# Patient Record
Sex: Female | Born: 1935 | Race: White | Hispanic: No | Smoking: Never smoker
Health system: Southern US, Community
[De-identification: ages and names within clinical notes are randomized; demographics above are authoritative.]

## PROBLEM LIST (undated history)

## (undated) DIAGNOSIS — Z9889 Other specified postprocedural states: Secondary | ICD-10-CM

## (undated) DIAGNOSIS — I499 Cardiac arrhythmia, unspecified: Secondary | ICD-10-CM

## (undated) DIAGNOSIS — Z8719 Personal history of other diseases of the digestive system: Secondary | ICD-10-CM

## (undated) DIAGNOSIS — M1990 Unspecified osteoarthritis, unspecified site: Secondary | ICD-10-CM

## (undated) DIAGNOSIS — H9190 Unspecified hearing loss, unspecified ear: Secondary | ICD-10-CM

## (undated) DIAGNOSIS — E785 Hyperlipidemia, unspecified: Secondary | ICD-10-CM

## (undated) DIAGNOSIS — Z974 Presence of external hearing-aid: Secondary | ICD-10-CM

## (undated) DIAGNOSIS — R112 Nausea with vomiting, unspecified: Secondary | ICD-10-CM

## (undated) HISTORY — PX: RECTOCELE REPAIR: SHX761

## (undated) HISTORY — PX: TYMPANOPLASTY: SHX33

## (undated) HISTORY — PX: EYE SURGERY: SHX253

## (undated) HISTORY — PX: JOINT REPLACEMENT: SHX530

## (undated) HISTORY — PX: CARPAL TUNNEL RELEASE: SHX101

## (undated) HISTORY — PX: ABDOMINAL HYSTERECTOMY: SHX81

---

## 2000-01-07 ENCOUNTER — Other Ambulatory Visit: Admission: RE | Admit: 2000-01-07 | Discharge: 2000-01-07 | Payer: Self-pay | Admitting: Internal Medicine

## 2007-05-27 ENCOUNTER — Inpatient Hospital Stay (HOSPITAL_COMMUNITY): Admission: RE | Admit: 2007-05-27 | Discharge: 2007-05-31 | Payer: Self-pay | Admitting: Orthopedic Surgery

## 2007-09-27 ENCOUNTER — Encounter: Admission: RE | Admit: 2007-09-27 | Discharge: 2007-09-27 | Payer: Self-pay | Admitting: Gastroenterology

## 2010-09-24 NOTE — Op Note (Signed)
Kimberly Donovan, Kimberly Donovan              ACCOUNT NO.:  [id number]   MEDICAL RECORD NO.:  [id number]          PATIENT TYPE:  INP   LOCATION:  2550                         FACILITY:  [facility]   PHYSICIAN:  Vania Rea. Supple, M.D.  DATE OF BIRTH:  1918/09/06   DATE OF PROCEDURE:  05/27/2007  DATE OF DISCHARGE:                               OPERATIVE REPORT   PREOPERATIVE DIAGNOSIS:  End-stage right knee osteoarthrosis.   POSTOPERATIVE DIAGNOSIS:  End-stage right knee osteoarthrosis.   PROCEDURE:  Right cemented total knee arthroplasty utilizing a DePuy  Sigma implant, size 3 femur, a size 3 three tibia, a 10-mm thick  rotating platform polyethylene insert and a 35-mm patellar button.   SURGEON OF RECORD:  Vania Rea. Supple, M.D.   ASSISTANT:  Tray Shuford, PA-C.   ANESTHESIA:  Spinal as well as a preop femoral nerve block.   TOURNIQUET TIME:  1 hour 4 minutes.   ESTIMATED BLOOD LOSS:  Approximately 150 mL.   DRAINS:  Hemovac x1.   HISTORY:  Ms. Exley is an 77 year old female who has had chronic and  progressively increasing right knee pain and functional limitations  secondary end-stage osteoarthrosis.  Due to her ongoing pain and  functional limitation, she is brought to operating room at this time for  planned right total knee arthroplasty.   Preoperatively I counseled Ms. Hayes on treatment options as well as  risks versus benefits thereof.  The possible surgical complications  including bleeding, infection, neurovascular injury, DVT, PE, persistent  pain, loss motion and possible need for revision surgery were all  reviewed.  She understands and accepts and agrees with our planned  procedure.   PROCEDURE IN DETAIL:  After undergoing routine preop evaluation, the  patient received prophylactic antibiotics.  A femoral nerve block was  established in the holding area by the anesthesia department.  Taken to  the operating room and on the operating table in the lateral position  had a  spinal anesthetic placed by the anesthesia department.  She was  then placed supine.  A Foley catheter was placed.  A tourniquet applied  to the right thigh.  The right leg was sterilely prepped and draped in  standard fashion.  The leg was exsanguinated with the tourniquet  inflated to 300 mmHg.  An anterior midline incision then made  approximately 15 cm in length, centered over the patella.  Skin flaps  were circumferentially mobilized and sutured back.  Electrocautery was  used for hemostasis.  A medial parapatellar arthrotomy was performed.  The patella was everted and approximately half of the fat pad was  excised.  The knee was then flexed up and the remnants of menisci were  removed as the cruciate ligaments were divided.  A drill was then used  to gain access to the femoral intramedullary canal and then an  intramedullary guide was passed.  We performed an 11-mm resection from  the distal femur to 5-degree valgus cut.  The distal femur was then  sized with the size 3 having the best fit and coverage.  We placed the  size 3 cutting guide.  The anterior-posterior and chamfer cuts were then  made using the cutting guide.  Trial reduction showed good fit of the  femoral implant.  We then turned our attention to the proximal tibia.  Using the extramedullary guide, we performed a tibial cut removing 10 mm  of bone from the medial tibia.  The proximal tibia was then measured  with  size 3, had the best coverage.  The size 3 trial was then pinned  into place with a trial reduction and the knee showed excellent range of  motion with full extension and symmetric flexion and extension gap, good  soft tissue balance, and no varus or valgus laxity.  The proximal tibia  was then terminally prepared with the reamer and then a broach.  We then  turned our attention to the distal femur, where the box cut was then  made with the box cutting guide and the oscillating saw.  A final trial  was  performed with the box implant and again good soft tissue balance  and good fit of the implants.  Attention was turned to the patella,  where this was measured and the oscillating saw was then used to remove  approximately 8.5 mm of bone.  The stabilizing drill holes were then  made with the 35-mm patellar button.  At this point we used an osteotome  to remove osteophytes on the posterior femoral condyles and we cleared  all residual bony debris and soft tissue from the posteromedial and  posterolateral compartments and confirmed that proper soft tissue  releases had been completed.  The knee was then pulsatilely lavaged,  meticulously cleaned of all residual bony debris.  Cement was mixed at  the back table and at the appropriate consistency we then cemented all  implants into position.  Meticulous removal of all extra cement was  completed.  A final trial reduction was then performed with a size 10  and 12.5 implants and size 10 had the best soft tissue balance and  excellent knee mobility and good stability.  The final size 10 tray was  then placed in position.  The knee was reduced.  Excellent motion.  We  then brought a Hemovac drain out superolaterally.  The tourniquet was  then let down.  Hemostasis was obtained.  The wound was closed with  interrupted figure-of-eight #1 Vicryl sutures for parapatellar  arthrotomy, 2-0 Vicryl for subcu and intracuticular 3-0 Monocryl for the  skin, followed by Steri-Strips.  A bulky dry dressing was applied.  The  patient placed in a knee immobilizer and was then transferred to the  recovery room in stable condition.      Vania Rea. Supple, M.D.  Electronically Signed     KMS/MEDQ  D:  05/27/2007  T:  05/27/2007  Job:  [id number]

## 2010-09-27 NOTE — Discharge Summary (Signed)
NAMEIOWA, KAPPES              ACCOUNT NO.:  [id number]   MEDICAL RECORD NO.:  [id number]          PATIENT TYPE:  INP   LOCATION:  5040                         FACILITY:  [facility]   PHYSICIAN:  Vania Rea. Supple, M.D.  DATE OF BIRTH:  01-22-1919   DATE OF ADMISSION:  05/27/2007  DATE OF DISCHARGE:  05/31/2007                               DISCHARGE SUMMARY   ADMISSION DIAGNOSIS:  Right end stage osteoarthrosis, right knee.   DISCHARGE DIAGNOSES:  1. Right end stage osteoarthrosis, right knee.  2. Status post right total knee arthroplasty.   OPERATIONS:  Right total knee arthroplasty.  Surgeon:  Vania Rea. Supple,  M.D.  Assistant:  Lucita Lora. Shuford, P.A.-C.  Spinal anesthetic.   BRIEF HISTORY:  Ms. Elice Crigger is an 77 year old female who has been  followed by [place] for some time for ongoing difficulty with osteoarthrosis  in her knees.  Her right knee continues to be the most symptomatic.  It  has not gotten to the point where it is keeping her from being able to  walk.  She is quite active and is very young physiologically for her  age.  At this time it is significantly impacting her life and we have  discussed total knee arthroplasty.  Risks and benefits were discussed  and she was in agreement and wished to proceed.   HOSPITAL COURSE:  The patient was admitted and underwent the above-  mentioned operative procedure and tolerated this well.  All appropriate  IVs, antibiotics, and analgesics were utilized.  She was placed  weightbearing as tolerated, total knee protocol, also on Coumadin for  DVT and PE prophylaxis.  She was able to be weaned off of IV medicines  on postoperative day two.  She did have some dyspepsia and trouble  eating the first 2-3 days and Reglan was started as well as Protonix  which did have good result.  On postoperative days three and four, she did have one episode of an  irregular heart rate that she has had as an outpatient and had a  negative workup.   With this, I did get an EKG that was specifically just  showed ST abnormality.  We did get cardiac enzymes which were negative  and she had no further episodes of this throughout her hospital course.  On the date of discharge, May 31, 2007, her incision was clean and  dry.  She was afebrile.  She was using Tylenol only at this time for  pain and had met all her therapy goals.  On postoperative day three At  this time she was medically and orthopedically stable for discharge to  home with home health PT, OT.   LABORATORY DATA:  Shows admission labs all within normal limits.  Postoperatively, her hemoglobin was stable at 10.5, 9.7, an d 9.6 on  postoperative days 1-3.  Chemistries also within normal limits.  Urinalysis was clear on admission.  EKG showed a normal sinus rhythm.  I  do not see a chest x-ray at time of dictation.   CONDITION ON DISCHARGE:  Stable and improved.   DISCHARGE  PLAN:  1. The patient is being discharged to home.  2. Home health PT OT are arranged.  3. Resume her other home meds and diet.  4. Prescription for Coumadin as well as Vicodin are provided for more      severe pain.  5. Call for followup in 2 weeks.  6. Weightbearing as tolerated.  7. May shower at this time.      Tracy A. Shuford, P.A.-C.      Vania Rea. Supple, M.D.  Electronically Signed    TAS/MEDQ  D:  06/10/2007  T:  06/10/2007  Job:  [id number]

## 2010-11-15 ENCOUNTER — Emergency Department (HOSPITAL_COMMUNITY): Payer: Medicare Other

## 2010-11-15 ENCOUNTER — Inpatient Hospital Stay (INDEPENDENT_AMBULATORY_CARE_PROVIDER_SITE_OTHER)
Admission: RE | Admit: 2010-11-15 | Discharge: 2010-11-15 | Disposition: A | Discharge status: Home / Self Care | Payer: Medicare Other | Source: Ambulatory Visit | Attending: Family Medicine | Admitting: Family Medicine

## 2010-11-15 ENCOUNTER — Emergency Department (HOSPITAL_COMMUNITY)
Admission: EM | Admit: 2010-11-15 | Discharge: 2010-11-15 | Disposition: A | Discharge status: Home / Self Care | Payer: Medicare Other | Attending: Emergency Medicine | Admitting: Emergency Medicine

## 2010-11-15 DIAGNOSIS — Z79899 Other long term (current) drug therapy: Secondary | ICD-10-CM | POA: Insufficient documentation

## 2010-11-15 DIAGNOSIS — R55 Syncope and collapse: Secondary | ICD-10-CM | POA: Insufficient documentation

## 2010-11-15 DIAGNOSIS — R42 Dizziness and giddiness: Secondary | ICD-10-CM

## 2010-11-15 LAB — COMPREHENSIVE METABOLIC PANEL
ALT: 19 U/L (ref 0–35)
Albumin: 3.8 g/dL (ref 3.5–5.2)
Alkaline Phosphatase: 47 U/L (ref 39–117)
Potassium: 4.2 mEq/L (ref 3.5–5.1)
Sodium: 140 mEq/L (ref 135–145)
Total Protein: 6.6 g/dL (ref 6.0–8.3)

## 2010-11-15 LAB — DIFFERENTIAL
Basophils Absolute: 0 10*3/uL (ref 0.0–0.1)
Eosinophils Absolute: 0.2 10*3/uL (ref 0.0–0.7)
Eosinophils Relative: 3 % (ref 0–5)
Lymphocytes Relative: 28 % (ref 12–46)
Neutrophils Relative %: 61 % (ref 43–77)

## 2010-11-15 LAB — CBC
HCT: 43.8 % (ref 36.0–46.0)
Platelets: 291 10*3/uL (ref 150–400)
RBC: 4.91 MIL/uL (ref 3.87–5.11)
RDW: 12.9 % (ref 11.5–15.5)
WBC: 6.9 10*3/uL (ref 4.0–10.5)

## 2010-11-15 LAB — URINALYSIS, ROUTINE W REFLEX MICROSCOPIC
Bilirubin Urine: NEGATIVE
Glucose, UA: NEGATIVE mg/dL
Hgb urine dipstick: NEGATIVE
Ketones, ur: NEGATIVE mg/dL
pH: 5.5 (ref 5.0–8.0)

## 2010-11-16 LAB — URINE CULTURE
Colony Count: 25000
Culture  Setup Time: 201207062210

## 2011-01-30 LAB — URINALYSIS, ROUTINE W REFLEX MICROSCOPIC
Glucose, UA: NEGATIVE
Specific Gravity, Urine: 1.017
pH: 5.5

## 2011-01-30 LAB — CBC
HCT: 27.4 — ABNORMAL LOW
HCT: 28.1 — ABNORMAL LOW
HCT: 29.5 — ABNORMAL LOW
Hemoglobin: 9.6 — ABNORMAL LOW
Hemoglobin: 9.7 — ABNORMAL LOW
MCHC: 34.5
MCV: 90.4
MCV: 90.1
MCV: 90.3
Platelets: 352
Platelets: 240
RBC: 3.1 — ABNORMAL LOW
RBC: 3.27 — ABNORMAL LOW
RDW: 12.6
WBC: 5.8
WBC: 7.3
WBC: 7.8

## 2011-01-30 LAB — BASIC METABOLIC PANEL
BUN: 13
CO2: 28
Chloride: 101
Chloride: 102
Glucose, Bld: 109 — ABNORMAL HIGH
Potassium: 4
Potassium: 4.2
Sodium: 134 — ABNORMAL LOW
Sodium: 136

## 2011-01-30 LAB — COMPREHENSIVE METABOLIC PANEL
ALT: 19
AST: 18
CO2: 29
Calcium: 9.3
GFR calc non Af Amer: >60
Sodium: 140

## 2011-01-30 LAB — CARDIAC PANEL(CRET KIN+CKTOT+MB+TROPI)
CK, MB: 1.4
Troponin I: 0.01

## 2011-01-30 LAB — PROTIME-INR
INR: 1.2
INR: 1.4
Prothrombin Time: 15.2

## 2011-01-30 LAB — DIFFERENTIAL
Basophils Absolute: 0
Eosinophils Relative: 4
Lymphocytes Relative: 26
Lymphs Abs: 1.5
Monocytes Absolute: 0.5
Monocytes Relative: 9

## 2011-05-09 ENCOUNTER — Encounter (HOSPITAL_COMMUNITY): Payer: Self-pay | Admitting: Pharmacy Technician

## 2011-05-16 DIAGNOSIS — M25569 Pain in unspecified knee: Secondary | ICD-10-CM | POA: Diagnosis not present

## 2011-05-16 DIAGNOSIS — M1710 Unilateral primary osteoarthritis, unspecified knee: Secondary | ICD-10-CM | POA: Diagnosis not present

## 2011-05-18 NOTE — H&P (Signed)
  Kimberly Donovan is an 78 y.o. female.    Chief Complaint: left knee OA and pain   HPI: Pt is a 78 y.o. female complaining of left knee pain for a couple of years. Pain had continually increased since the beginning. History of a previous right TKA about 4-5 year ago. X-rays in the clinic show end-stage arthritic changes of the left knee. Pt has tried various conservative treatments which have failed to alleviate their symptoms including NSAIDs and cortisone injections. Various options are discussed with the patient. Risks, benefits and expectations were discussed with the patient. Patient understand the risks, benefits and expectations and wishes to proceed with surgery.   PCP:  No primary provider on file.  D/C Plans: Rehab  Post-op Meds: No Rx given  Tranexamic Acid:  To be given  PMH: No past medical history on file.  PSH: Right total knee arthroplasty Hysterectomy Rectocele repair Carpal Tunnel release  Social History:  does not have a smoking history on file. She does not have any smokeless tobacco history on file. Her alcohol and drug histories not on file.  Allergies:  No Known Allergies  Medications: Multivitamin one PO daily  ROS: Review of Systems  Constitutional: Negative.   HENT: Positive for hearing loss.   Eyes: Negative.   Respiratory: Negative.   Cardiovascular: Negative.   Gastrointestinal: Negative.   Genitourinary: Negative.   Musculoskeletal: Positive for joint pain (left knee).  Skin: Negative.   Neurological: Negative.   Endo/Heme/Allergies: Negative.   Psychiatric/Behavioral: Negative.      Physican Exam: Physical Exam  Constitutional: She is oriented to person, place, and time and well-developed, well-nourished, and in no distress.  HENT:  Head: Normocephalic and atraumatic.  Nose: Nose normal.  Mouth/Throat: Oropharynx is clear and moist.  Eyes: Pupils are equal, round, and reactive to light.  Neck: Neck supple. No JVD present. No  tracheal deviation present. No thyromegaly present.  Cardiovascular: Normal rate, regular rhythm, normal heart sounds and intact distal pulses.   Pulmonary/Chest: Effort normal and breath sounds normal. No stridor. No respiratory distress. She has no wheezes. She has no rales. She exhibits no tenderness.  Abdominal: Soft. There is no tenderness. There is no guarding.  Musculoskeletal:       Left knee: She exhibits decreased range of motion (0-110 with crepitus) and bony tenderness. She exhibits no swelling, no effusion, no ecchymosis, no deformity and no laceration. tenderness found.  Lymphadenopathy:    She has no cervical adenopathy (exam).  Neurological: She is alert and oriented to person, place, and time.  Skin: Skin is warm and dry.  Psychiatric: Affect normal.     Assessment/Plan Assessment: left knee OA and pain   Plan: Patient will undergo a left total knee arthroplasty on 05/27/2011. Risks benefits and expectation were discussed with the patient. Patient understand risks, benefits and expectation and wishes to proceed.   Kimberly Donovan   PAC  05/18/2011, 11:19 PM

## 2011-05-19 DIAGNOSIS — M25569 Pain in unspecified knee: Secondary | ICD-10-CM | POA: Diagnosis not present

## 2011-05-19 DIAGNOSIS — M1710 Unilateral primary osteoarthritis, unspecified knee: Secondary | ICD-10-CM | POA: Diagnosis not present

## 2011-05-19 DIAGNOSIS — Z01818 Encounter for other preprocedural examination: Secondary | ICD-10-CM | POA: Diagnosis not present

## 2011-05-20 ENCOUNTER — Encounter (HOSPITAL_COMMUNITY): Payer: Self-pay

## 2011-05-20 ENCOUNTER — Encounter (HOSPITAL_COMMUNITY)
Admission: RE | Admit: 2011-05-20 | Discharge: 2011-05-20 | Disposition: A | Discharge status: Home / Self Care | Payer: Medicare Other | Source: Ambulatory Visit | Attending: Orthopedic Surgery | Admitting: Orthopedic Surgery

## 2011-05-20 HISTORY — DX: Nausea with vomiting, unspecified: R11.2

## 2011-05-20 HISTORY — DX: Hyperlipidemia, unspecified: E78.5

## 2011-05-20 HISTORY — DX: Personal history of other diseases of the digestive system: Z87.19

## 2011-05-20 HISTORY — DX: Other specified postprocedural states: Z98.890

## 2011-05-20 HISTORY — DX: Unspecified osteoarthritis, unspecified site: M19.90

## 2011-05-20 HISTORY — DX: Cardiac arrhythmia, unspecified: I49.9

## 2011-05-20 LAB — BASIC METABOLIC PANEL
BUN: 18 mg/dL (ref 6–23)
Calcium: 10 mg/dL (ref 8.4–10.5)
GFR calc Af Amer: 84 mL/min — ABNORMAL LOW (ref >90)
GFR calc non Af Amer: 72 mL/min — ABNORMAL LOW (ref >90)
Glucose, Bld: 92 mg/dL (ref 70–99)
Potassium: 4.4 mEq/L (ref 3.5–5.1)
Sodium: 140 mEq/L (ref 135–145)

## 2011-05-20 LAB — URINALYSIS, ROUTINE W REFLEX MICROSCOPIC
Bilirubin Urine: NEGATIVE
Ketones, ur: NEGATIVE mg/dL
Nitrite: NEGATIVE
Protein, ur: NEGATIVE mg/dL
Urobilinogen, UA: 0.2 mg/dL (ref 0.0–1.0)

## 2011-05-20 LAB — PROTIME-INR
INR: 0.97 (ref 0.00–1.49)
Prothrombin Time: 13.1 seconds (ref 11.6–15.2)

## 2011-05-20 LAB — CBC
MCH: 30.8 pg (ref 26.0–34.0)
MCHC: 34.2 g/dL (ref 30.0–36.0)
Platelets: 319 10*3/uL (ref 150–400)
RBC: 4.9 MIL/uL (ref 3.87–5.11)

## 2011-05-20 LAB — DIFFERENTIAL
Basophils Absolute: 0.1 10*3/uL (ref 0.0–0.1)
Basophils Relative: 1 % (ref 0–1)
Eosinophils Absolute: 0.3 10*3/uL (ref 0.0–0.7)
Neutro Abs: 4.7 10*3/uL (ref 1.7–7.7)
Neutrophils Relative %: 64 % (ref 43–77)

## 2011-05-20 MED ORDER — CHLORHEXIDINE GLUCONATE 4 % EX LIQD: 60.0000 mL | Freq: Once | CUTANEOUS | Status: DC

## 2011-05-20 NOTE — Pre-Procedure Instructions (Signed)
Clearance Dr Donette Larry with note, office visit note and EKg 1/13 on chart, CHEST XRAY  7/12 in EPIC

## 2011-05-20 NOTE — Patient Instructions (Signed)
20 Kimberly Donovan  05/20/2011   Your procedure is scheduled on:  05/27/11   Tuesday   Surgery [id number]  Report to [facility] at 1500 PM.     3:00PM  Call this number if you have problems the morning of surgery: [phone]   [facility]  PST   [phone]   Remember:   Do not eat food:After Midnight. Monday night  May have clear liquids:up to 6 Hours before arrival. Until 0900am Tuesday then NONE  Clear liquids include soda, tea, black coffee, apple or grape juice, broth.  Take these medicines the morning of surgery with A SIP OF WATER:Tylenol with sip water if needed   Do not wear jewelry, make-up or nail polish.  Do not wear lotions, powders, or perfumes. You may wear deodorant.  Do not shave 48 hours prior to surgery.  Do not bring valuables to the hospital.  Contacts, dentures or bridgework may not be worn into surgery.  Leave suitcase in the car. After surgery it may be brought to your room.  For patients admitted to the hospital, checkout time is 11:00 AM the day of discharge.   Patients discharged the day of surgery will not be allowed to drive home.  Name and phone number of your driver:? [facility] Special Instructions: CHG Shower Use Special Wash: 1/2 bottle night before surgery and 1/2 bottle morning of surgery. Regular soap face and privates   Please read over the following fact sheets that you were given: MRSA Information

## 2011-05-27 ENCOUNTER — Inpatient Hospital Stay (HOSPITAL_COMMUNITY)
Admission: RE | Admit: 2011-05-27 | Discharge: 2011-05-30 | DRG: 470 | Disposition: A | Discharge status: Skilled Nursing Facility | Payer: Medicare Other | Source: Ambulatory Visit | Attending: Orthopedic Surgery | Admitting: Orthopedic Surgery

## 2011-05-27 ENCOUNTER — Encounter (HOSPITAL_COMMUNITY): Payer: Self-pay | Admitting: *Deleted

## 2011-05-27 ENCOUNTER — Encounter (HOSPITAL_COMMUNITY)
Admission: RE | Disposition: A | Discharge status: Skilled Nursing Facility | Payer: Self-pay | Source: Ambulatory Visit | Attending: Orthopedic Surgery

## 2011-05-27 ENCOUNTER — Encounter (HOSPITAL_COMMUNITY): Payer: Self-pay | Admitting: Anesthesiology

## 2011-05-27 ENCOUNTER — Inpatient Hospital Stay (HOSPITAL_COMMUNITY): Payer: Medicare Other | Admitting: Anesthesiology

## 2011-05-27 DIAGNOSIS — K5900 Constipation, unspecified: Secondary | ICD-10-CM | POA: Diagnosis not present

## 2011-05-27 DIAGNOSIS — Z01812 Encounter for preprocedural laboratory examination: Secondary | ICD-10-CM | POA: Diagnosis not present

## 2011-05-27 DIAGNOSIS — K449 Diaphragmatic hernia without obstruction or gangrene: Secondary | ICD-10-CM | POA: Diagnosis present

## 2011-05-27 DIAGNOSIS — Z96659 Presence of unspecified artificial knee joint: Secondary | ICD-10-CM

## 2011-05-27 DIAGNOSIS — S8990XA Unspecified injury of unspecified lower leg, initial encounter: Secondary | ICD-10-CM | POA: Diagnosis not present

## 2011-05-27 DIAGNOSIS — M25569 Pain in unspecified knee: Secondary | ICD-10-CM | POA: Diagnosis not present

## 2011-05-27 DIAGNOSIS — Z5189 Encounter for other specified aftercare: Secondary | ICD-10-CM | POA: Diagnosis not present

## 2011-05-27 DIAGNOSIS — M1710 Unilateral primary osteoarthritis, unspecified knee: Secondary | ICD-10-CM | POA: Diagnosis not present

## 2011-05-27 DIAGNOSIS — IMO0002 Reserved for concepts with insufficient information to code with codable children: Secondary | ICD-10-CM | POA: Diagnosis not present

## 2011-05-27 HISTORY — PX: TOTAL KNEE ARTHROPLASTY: SHX125

## 2011-05-27 LAB — TYPE AND SCREEN: Antibody Screen: NEGATIVE

## 2011-05-27 SURGERY — ARTHROPLASTY, KNEE, TOTAL
Anesthesia: Spinal | Site: Knee | Laterality: Left | Wound class: Clean

## 2011-05-27 MED ORDER — BUPIVACAINE HCL 0.75 % IJ SOLN
INTRAMUSCULAR | Status: DC | PRN
  Administered 2011-05-27: 13.5 mg

## 2011-05-27 MED ORDER — KETOROLAC TROMETHAMINE 30 MG/ML IJ SOLN
INTRAMUSCULAR | Status: DC | PRN
  Administered 2011-05-27: 30 mg via INTRAVENOUS

## 2011-05-27 MED ORDER — LACTATED RINGERS IV SOLN
INTRAVENOUS | Status: DC | PRN
  Administered 2011-05-27: 19:00:00 via INTRAVENOUS

## 2011-05-27 MED ORDER — FENTANYL CITRATE 0.05 MG/ML IJ SOLN
INTRAMUSCULAR | Status: DC | PRN
  Administered 2011-05-27: 25 ug via INTRAVENOUS

## 2011-05-27 MED ORDER — PROPOFOL 10 MG/ML IV EMUL
INTRAVENOUS | Status: DC | PRN
  Administered 2011-05-27: 50 ug/kg/min via INTRAVENOUS

## 2011-05-27 MED ORDER — DEXAMETHASONE SODIUM PHOSPHATE 10 MG/ML IJ SOLN: 10.0000 mg | Freq: Once | INTRAMUSCULAR | Status: DC

## 2011-05-27 MED ORDER — STERILE WATER FOR IRRIGATION IR SOLN
Status: DC | PRN
  Administered 2011-05-27: 1500 mL

## 2011-05-27 MED ORDER — TRANEXAMIC ACID 100 MG/ML IV SOLN
15.0000 mg/kg | Freq: Once | INTRAVENOUS | Status: AC
  Administered 2011-05-27: 931.5 mg via INTRAVENOUS
  Filled 2011-05-27: qty 9.32

## 2011-05-27 MED ORDER — PHENYLEPHRINE HCL 10 MG/ML IJ SOLN
10.0000 mg | INTRAVENOUS | Status: DC | PRN
  Administered 2011-05-27: 40 ug/min via INTRAVENOUS

## 2011-05-27 MED ORDER — SODIUM CHLORIDE 0.9 % IV SOLN
INTRAVENOUS | Status: DC
  Administered 2011-05-28: 01:00:00 via INTRAVENOUS
  Filled 2011-05-27 (×8): qty 1000

## 2011-05-27 MED ORDER — DEXAMETHASONE SODIUM PHOSPHATE 10 MG/ML IJ SOLN
INTRAMUSCULAR | Status: DC | PRN
  Administered 2011-05-27: 10 mg via INTRAVENOUS

## 2011-05-27 MED ORDER — BUPIVACAINE-EPINEPHRINE PF 0.25-1:200000 % IJ SOLN
INTRAMUSCULAR | Status: DC | PRN
  Administered 2011-05-27: 60 mL

## 2011-05-27 MED ORDER — LACTATED RINGERS IV SOLN: INTRAVENOUS | Status: DC

## 2011-05-27 MED ORDER — CEFAZOLIN SODIUM 1-5 GM-% IV SOLN
1.0000 g | INTRAVENOUS | Status: AC
  Administered 2011-05-27: 1 g via INTRAVENOUS

## 2011-05-27 MED ORDER — PHENYLEPHRINE HCL 10 MG/ML IJ SOLN
INTRAMUSCULAR | Status: DC | PRN
  Administered 2011-05-27 (×2): 40 ug via INTRAVENOUS

## 2011-05-27 MED ORDER — 0.9 % SODIUM CHLORIDE (POUR BTL) OPTIME
TOPICAL | Status: DC | PRN
  Administered 2011-05-27: 1000 mL

## 2011-05-27 SURGICAL SUPPLY — 63 items
ADH SKN CLS APL DERMABOND .7 (GAUZE/BANDAGES/DRESSINGS) ×1
BAG SPEC THK2 15X12 ZIP CLS (MISCELLANEOUS) ×1
BAG ZIPLOCK 12X15 (MISCELLANEOUS) ×2 IMPLANT
BANDAGE ELASTIC 6 VELCRO ST LF (GAUZE/BANDAGES/DRESSINGS) ×2 IMPLANT
BANDAGE ESMARK 6X9 LF (GAUZE/BANDAGES/DRESSINGS) ×1 IMPLANT
BLADE SAW SGTL 13.0X1.19X90.0M (BLADE) ×2 IMPLANT
BNDG CMPR 9X6 STRL LF SNTH (GAUZE/BANDAGES/DRESSINGS) ×1
BNDG ESMARK 6X9 LF (GAUZE/BANDAGES/DRESSINGS) ×2
BOWL SMART MIX CTS (DISPOSABLE) ×2 IMPLANT
CEMENT HV SMART SET (Cement) ×2 IMPLANT
CLOTH BEACON ORANGE TIMEOUT ST (SAFETY) ×2 IMPLANT
CUFF TOURN SGL QUICK 34 (TOURNIQUET CUFF) ×2
CUFF TRNQT CYL 34X4X40X1 (TOURNIQUET CUFF) ×1 IMPLANT
DECANTER SPIKE VIAL GLASS SM (MISCELLANEOUS) ×2 IMPLANT
DERMABOND ADVANCED (GAUZE/BANDAGES/DRESSINGS) ×1
DERMABOND ADVANCED .7 DNX12 (GAUZE/BANDAGES/DRESSINGS) ×1 IMPLANT
DRAPE EXTREMITY T 121X128X90 (DRAPE) ×2 IMPLANT
DRAPE POUCH INSTRU U-SHP 10X18 (DRAPES) ×2 IMPLANT
DRAPE U-SHAPE 47X51 STRL (DRAPES) ×2 IMPLANT
DRSG AQUACEL AG ADV 3.5X10 (GAUZE/BANDAGES/DRESSINGS) ×2 IMPLANT
DRSG TEGADERM 4X4.75 (GAUZE/BANDAGES/DRESSINGS) ×2 IMPLANT
DURAPREP 26ML APPLICATOR (WOUND CARE) ×2 IMPLANT
ELECT REM PT RETURN 9FT ADLT (ELECTROSURGICAL) ×2
ELECTRODE REM PT RTRN 9FT ADLT (ELECTROSURGICAL) ×1 IMPLANT
EVACUATOR 1/8 PVC DRAIN (DRAIN) ×2 IMPLANT
FACESHIELD LNG OPTICON STERILE (SAFETY) ×10 IMPLANT
GAUZE SPONGE 2X2 8PLY STRL LF (GAUZE/BANDAGES/DRESSINGS) ×1 IMPLANT
GLOVE BIOGEL PI IND STRL 7.5 (GLOVE) ×1 IMPLANT
GLOVE BIOGEL PI IND STRL 8 (GLOVE) ×1 IMPLANT
GLOVE BIOGEL PI INDICATOR 7.5 (GLOVE) ×1
GLOVE BIOGEL PI INDICATOR 8 (GLOVE) ×1
GLOVE ECLIPSE 8.0 STRL XLNG CF (GLOVE) ×2 IMPLANT
GLOVE ORTHO TXT STRL SZ7.5 (GLOVE) ×4 IMPLANT
GLOVE SURG SS PI 8.0 STRL IVOR (GLOVE) ×1 IMPLANT
GLOVE SURG SS PI 8.5 STRL IVOR (GLOVE) ×1
GLOVE SURG SS PI 8.5 STRL STRW (GLOVE) IMPLANT
GOWN BRE IMP SLV AUR XL STRL (GOWN DISPOSABLE) ×1 IMPLANT
GOWN PREVENTION PLUS XXLARGE (GOWN DISPOSABLE) ×1 IMPLANT
GOWN STRL NON-REIN LRG LVL3 (GOWN DISPOSABLE) ×2 IMPLANT
GOWN STRL REIN 2XL XLG LVL4 (GOWN DISPOSABLE) ×1 IMPLANT
HANDPIECE INTERPULSE COAX TIP (DISPOSABLE) ×2
IMMOBILIZER KNEE 20 (SOFTGOODS) ×2
IMMOBILIZER KNEE 20 THIGH 36 (SOFTGOODS) IMPLANT
KIT BASIN OR (CUSTOM PROCEDURE TRAY) ×2 IMPLANT
MANIFOLD NEPTUNE II (INSTRUMENTS) ×2 IMPLANT
NDL SAFETY ECLIPSE 18X1.5 (NEEDLE) ×1 IMPLANT
NEEDLE HYPO 18GX1.5 SHARP (NEEDLE) ×2
NS IRRIG 1000ML POUR BTL (IV SOLUTION) ×4 IMPLANT
PACK TOTAL JOINT (CUSTOM PROCEDURE TRAY) ×2 IMPLANT
POSITIONER SURGICAL ARM (MISCELLANEOUS) ×2 IMPLANT
SET HNDPC FAN SPRY TIP SCT (DISPOSABLE) ×1 IMPLANT
SET PAD KNEE POSITIONER (MISCELLANEOUS) ×2 IMPLANT
SPONGE GAUZE 2X2 STER 10/PKG (GAUZE/BANDAGES/DRESSINGS) ×1
SUCTION FRAZIER 12FR DISP (SUCTIONS) ×2 IMPLANT
SUT MNCRL AB 4-0 PS2 18 (SUTURE) ×2 IMPLANT
SUT VIC AB 1 CT1 36 (SUTURE) ×6 IMPLANT
SUT VIC AB 2-0 CT1 27 (SUTURE) ×6
SUT VIC AB 2-0 CT1 TAPERPNT 27 (SUTURE) ×3 IMPLANT
SYR 50ML LL SCALE MARK (SYRINGE) ×2 IMPLANT
TOWEL OR 17X26 10 PK STRL BLUE (TOWEL DISPOSABLE) ×4 IMPLANT
TRAY FOLEY CATH 14FRSI W/METER (CATHETERS) ×2 IMPLANT
WATER STERILE IRR 1500ML POUR (IV SOLUTION) ×2 IMPLANT
WRAP KNEE MAXI GEL POST OP (GAUZE/BANDAGES/DRESSINGS) ×2 IMPLANT

## 2011-05-27 NOTE — Progress Notes (Signed)
Pt and daughter informed that surgery will be delayed until about 8 PM. They are upset. Pt given warm blanket and emotional support.

## 2011-05-27 NOTE — Anesthesia Preprocedure Evaluation (Signed)
Anesthesia Evaluation  Patient identified by MRN, date of birth, ID band Patient awake    Reviewed: Allergy & Precautions, H&P , NPO status , Patient's Chart, lab work & pertinent test results  History of Anesthesia Complications (+) PONV  Airway Mallampati: II TM Distance: >3 FB Neck ROM: Full    Dental No notable dental hx.    Pulmonary neg pulmonary ROS,  clear to auscultation  Pulmonary exam normal       Cardiovascular neg cardio ROS + dysrhythmias Regular Normal    Neuro/Psych Negative Neurological ROS  Negative Psych ROS   GI/Hepatic negative GI ROS, Neg liver ROS, hiatal hernia,   Endo/Other  Negative Endocrine ROS  Renal/GU negative Renal ROS  Genitourinary negative   Musculoskeletal negative musculoskeletal ROS (+)   Abdominal   Peds negative pediatric ROS (+)  Hematology negative hematology ROS (+)   Anesthesia Other Findings   Reproductive/Obstetrics negative OB ROS                           Anesthesia Physical Anesthesia Plan  ASA: III  Anesthesia Plan: Spinal   Post-op Pain Management:    Induction: Intravenous  Airway Management Planned:   Additional Equipment:   Intra-op Plan:   Post-operative Plan: Extubation in OR  Informed Consent: I have reviewed the patients History and Physical, chart, labs and discussed the procedure including the risks, benefits and alternatives for the proposed anesthesia with the patient or authorized representative who has indicated his/her understanding and acceptance.   Dental advisory given  Plan Discussed with: CRNA  Anesthesia Plan Comments: (Discussed risks/benefits of spinal including headache, backache, failure, bleeding, infection, and nerve damage. Patient consents to spinal. Questions answered. Coagulation studies and platelet count acceptable. )        Anesthesia Quick Evaluation

## 2011-05-27 NOTE — Anesthesia Postprocedure Evaluation (Addendum)
  Anesthesia Post-op Note  Patient: Kimberly Donovan  Procedure(s) Performed:  TOTAL KNEE ARTHROPLASTY  Patient Location: PACU  Anesthesia Type: General  Level of Consciousness: awake and alert   Airway and Oxygen Therapy: Patient Spontanous Breathing  Post-op Pain: mild  Post-op Assessment: Post-op Vital signs reviewed, Patient's Cardiovascular Status Stable, Respiratory Function Stable, Patent Airway and No signs of Nausea or vomiting  Post-op Vital Signs: stable; HR has increased to 145 briefly twice, before coming right back down to 70.  Will have her observed in telemetry.  Complications: No apparent anesthesia complications

## 2011-05-27 NOTE — Op Note (Signed)
NAME:  Kimberly Donovan                      MEDICAL RECORD NO.:  [id number]                             FACILITY:  [facility]      PHYSICIAN:  Madlyn Frankel. Charlann Boxer, M.D.  DATE OF BIRTH:  12/11/1918      DATE OF PROCEDURE:  05/27/2011                                     OPERATIVE REPORT         PREOPERATIVE DIAGNOSIS:  Left knee osteoarthritis.      POSTOPERATIVE DIAGNOSIS:  Left knee osteoarthritis.      FINDINGS:  The patient was noted to have complete loss of cartilage and   bone-on-bone arthritis with associated osteophytes in the lateral and patellofemoral compartments of   the knee with a significant synovitis and associated effusion.      PROCEDURE:  Left total knee replacement.      COMPONENTS USED:  DePuy rotating platform posterior stabilized knee   system, a size 3 femur, 3 tibia, 10 mm insert, and 38 patellar   button.      SURGEON:  Madlyn Frankel. Charlann Boxer, M.D.      ASSISTANT:  Lanney Gins, PA-C.      ANESTHESIA:  Spinal.      SPECIMENS:  None.      COMPLICATION:  None.      DRAINS:  One Hemovac.  EBL: 50cc      TOURNIQUET TIME:   Total Tourniquet Time Documented: Thigh (Left) - 44 minutes .      The patient was stable to the recovery room.      INDICATION FOR PROCEDURE:  Kimberly Donovan is a 78 y.o. female patient of   mine.  The patient had been seen, evaluated, and treated conservatively in the   office with medication, activity modification, and injections.  The patient had   radiographic changes of bone-on-bone arthritis with endplate sclerosis and osteophytes noted.      The patient failed conservative measures including medication, injections, and activity modification, and at this point was ready for more definitive measures.   Based on the radiographic changes and failed conservative measures, the patient   decided to proceed with total knee replacement.  Risks of infection,   DVT, component failure, need for revision surgery, postop course, and   expectations were all   discussed and reviewed.  Consent was obtained for benefit of pain   relief.      PROCEDURE IN DETAIL:  The patient was brought to the operative theater.   Once adequate anesthesia, preoperative antibiotics, 1 gm of Ancef administered, the patient was positioned supine with the left thigh tourniquet placed.  The  left lower extremity was prepped and draped in sterile fashion.  A time-   out was performed identifying the patient, planned procedure, and   extremity.      The left lower extremity was placed in the [facility] leg holder.  The leg was   exsanguinated, tourniquet elevated to 250 mmHg.  A midline incision was   made followed by median parapatellar arthrotomy.  Following initial   exposure, attention was first directed to the patella.  Precut  measurement was noted to be 24 mm.  I resected down to 14 mm and used a   38 patellar button to restore patellar height as well as cover the cut   surface.      The lug holes were drilled and a metal shim was placed to protect the   patella from retractors and saw blades.      At this point, attention was now directed to the femur.  The femoral   canal was opened with a drill, irrigated to try to prevent fat emboli.  An   intramedullary rod was passed at 3 degrees valgus, 10 mm of bone was   resected off the distal femur.  Following this resection, the tibia was   subluxated anteriorly.  Using the extramedullary guide, 2 mm of bone was resected off   the proximal lateral tibia.  We confirmed the gap would be   stable medially and laterally with a 10 mm insert as well as confirmed   the cut was perpendicular in the coronal plane, checking with an alignment rod.      Once this was done, I sized the femur to be a size 3 in the anterior-   posterior dimension, chose a standard component based on medial and   lateral dimension.  The size 3 rotation block was then pinned in   position anterior referenced using the C-clamp  to set rotation.  The   anterior, posterior, and  chamfer cuts were made without difficulty nor   notching making certain that I was along the anterior cortex to help   with flexion gap stability.      The final box cut was made off the lateral aspect of distal femur.      At this point, the tibia was sized to be a size 3, the size 3 tray was   then pinned in position through the medial third of the tubercle,   drilled, and keel punched.  Trial reduction was now carried with a 3 femur,  3 tibia, a 10 mm insert, and the 38 patella botton.  The knee was brought to   extension, full extension with good flexion stability with the patella   tracking through the trochlea without application of pressure.  Given   all these findings, the trial components removed.  Final components were   opened and cement was mixed.  The knee was irrigated with normal saline   solution and pulse lavage.  The synovial lining was   then injected with 0.25% Marcaine with epinephrine and 1 cc of Toradol,   total of 61 cc.      The knee was irrigated.  Final implants were then cemented onto clean and   dried cut surfaces of bone with the knee brought to extension with a 10   mm trial insert.      Once the cement had fully cured, the excess cement was removed   throughout the knee.  I confirmed I was satisfied with the range of   motion and stability, and the final 10 mm insert was chosen.  It was   placed into the knee.      The tourniquet had been let down at 44 minutes.  No significant   hemostasis required.  The medium Hemovac drain was placed deep.  The   extensor mechanism was then reapproximated using #1 Vicryl with the knee   in flexion.  The   remaining wound was closed with 2-0 Vicryl  and running 4-0 Monocryl.   The knee was cleaned, dried, dressed sterilely using Dermabond and   Aquacel dressing.  Drain site dressed separately.  The patient was then   brought to recovery room in stable condition,  tolerating the procedure   well.   Please note that Physician Assistant, Lanney Gins, was present for the entirety of the case, and was utilized for pre-operative positioning, peri-operative retractor management, general facilitation of the procedure.  He was also utilized for primary wound closure at the end of the case.              Madlyn Frankel Charlann Boxer, M.D.

## 2011-05-27 NOTE — Progress Notes (Signed)
Pt and daughter informed that surgery delayed by 1-1.5 hours. They verbalize understanding.

## 2011-05-27 NOTE — Interval H&P Note (Signed)
History and Physical Interval Note:  05/27/2011 6:55 PM  Kimberly Donovan  has presented today for surgery, with the diagnosis of Osteoarthritis of the Left Knee  The various methods of treatment have been discussed with the patient and family. After consideration of risks, benefits and other options for treatment, the patient has consented to  Procedure(s): LEFT TOTAL KNEE ARTHROPLASTY as a surgical intervention .  The patients' history has been reviewed, patient examined, no change in status, stable for surgery.  I have reviewed the patients' chart and labs.  Questions were answered to the patient's satisfaction.     Shelda Pal

## 2011-05-27 NOTE — Anesthesia Procedure Notes (Signed)
Spinal  Patient location during procedure: OR Start time: 05/27/2011 7:40 PM End time: 05/27/2011 7:46 PM Staffing CRNA/Resident: Sherald Barge Performed by: resident/CRNA  Preanesthetic Checklist Completed: patient identified, site marked, surgical consent, pre-op evaluation, timeout performed, IV checked, risks and benefits discussed and monitors and equipment checked Spinal Block Patient position: sitting Prep: Betadine Patient monitoring: heart rate, continuous pulse ox and blood pressure Location: L2-3 Injection technique: single-shot Needle Needle type: Spinocan  Needle gauge: 22 G Needle length: 9 cm Needle insertion depth: 5 cm Assessment Sensory level: T4 Additional Notes Expiration date of kit checked and confirmed. Patient tolerated procedure well, without complications. Clear CSF return, neg hem return.

## 2011-05-27 NOTE — Transfer of Care (Signed)
Immediate Anesthesia Transfer of Care Note  Patient: Kimberly Donovan  Procedure(s) Performed:  TOTAL KNEE ARTHROPLASTY  Patient Location: PACU  Anesthesia Type: Spinal  Level of Consciousness: sedated, patient cooperative and responds to stimulaton  Airway & Oxygen Therapy: Patient Spontanous Breathing and Patient connected to face mask oxgen  Post-op Assessment: Report given to PACU RN and Post -op Vital signs reviewed and stable  Post vital signs: Reviewed and stable  Complications: No apparent anesthesia complications, noted T-12 level on exam.   Filed Vitals:   05/27/11 1511  BP: 157/84  Pulse: 73  Temp: 36.1 C

## 2011-05-28 DIAGNOSIS — Z96659 Presence of unspecified artificial knee joint: Secondary | ICD-10-CM

## 2011-05-28 LAB — CBC
Hemoglobin: 13 g/dL (ref 12.0–15.0)
MCHC: 34.3 g/dL (ref 30.0–36.0)
RDW: 12.7 % (ref 11.5–15.5)

## 2011-05-28 LAB — BASIC METABOLIC PANEL
GFR calc Af Amer: 87 mL/min — ABNORMAL LOW (ref >90)
GFR calc non Af Amer: 75 mL/min — ABNORMAL LOW (ref >90)
Potassium: 4 mEq/L (ref 3.5–5.1)
Sodium: 139 mEq/L (ref 135–145)

## 2011-05-28 MED ORDER — ONDANSETRON HCL 4 MG PO TABS
4.0000 mg | ORAL_TABLET | Freq: Four times a day (QID) | ORAL | Status: DC | PRN
  Administered 2011-05-28: 4 mg via ORAL
  Filled 2011-05-28: qty 1

## 2011-05-28 MED ORDER — DOCUSATE SODIUM 100 MG PO CAPS
100.0000 mg | ORAL_CAPSULE | Freq: Two times a day (BID) | ORAL | Status: DC
  Administered 2011-05-28 – 2011-05-30 (×5): 100 mg via ORAL
  Filled 2011-05-28 (×6): qty 1

## 2011-05-28 MED ORDER — DEXTROSE 5 % IV SOLN
500.0000 mg | Freq: Four times a day (QID) | INTRAVENOUS | Status: DC | PRN
  Filled 2011-05-28: qty 5

## 2011-05-28 MED ORDER — ACETAMINOPHEN 650 MG RE SUPP: 650.0000 mg | Freq: Four times a day (QID) | RECTAL | Status: DC | PRN

## 2011-05-28 MED ORDER — HYDROMORPHONE HCL PF 1 MG/ML IJ SOLN
0.5000 mg | INTRAMUSCULAR | Status: DC | PRN
  Administered 2011-05-28 – 2011-05-29 (×5): 1 mg via INTRAVENOUS
  Filled 2011-05-28 (×6): qty 1

## 2011-05-28 MED ORDER — ADULT MULTIVITAMIN W/MINERALS CH
1.0000 | ORAL_TABLET | Freq: Every day | ORAL | Status: DC
  Administered 2011-05-28 – 2011-05-30 (×3): 1 via ORAL
  Filled 2011-05-28 (×3): qty 1

## 2011-05-28 MED ORDER — OMEGA-3 FATTY ACIDS 1000 MG PO CAPS: 1.0000 g | ORAL_CAPSULE | Freq: Every day | ORAL | Status: DC

## 2011-05-28 MED ORDER — PRESERVISION AREDS PO CAPS: ORAL_CAPSULE | Freq: Every day | ORAL | Status: DC

## 2011-05-28 MED ORDER — METOCLOPRAMIDE HCL 10 MG PO TABS: 5.0000 mg | ORAL_TABLET | Freq: Three times a day (TID) | ORAL | Status: DC | PRN

## 2011-05-28 MED ORDER — PROSIGHT PO TABS
1.0000 | ORAL_TABLET | Freq: Every day | ORAL | Status: DC
  Administered 2011-05-28 – 2011-05-30 (×3): 1 via ORAL
  Filled 2011-05-28 (×3): qty 1

## 2011-05-28 MED ORDER — ONDANSETRON HCL 4 MG/2ML IJ SOLN
4.0000 mg | Freq: Four times a day (QID) | INTRAMUSCULAR | Status: DC | PRN
  Administered 2011-05-29: 4 mg via INTRAVENOUS
  Filled 2011-05-28: qty 2

## 2011-05-28 MED ORDER — RIVAROXABAN 10 MG PO TABS
10.0000 mg | ORAL_TABLET | Freq: Every day | ORAL | Status: DC
  Administered 2011-05-28 – 2011-05-30 (×3): 10 mg via ORAL
  Filled 2011-05-28 (×3): qty 1

## 2011-05-28 MED ORDER — CALCIUM CARBONATE-VITAMIN D 500-200 MG-UNIT PO TABS
1.0000 | ORAL_TABLET | Freq: Every day | ORAL | Status: DC
  Administered 2011-05-28 – 2011-05-30 (×3): 1 via ORAL
  Filled 2011-05-28 (×3): qty 1

## 2011-05-28 MED ORDER — ASPIRIN EC 81 MG PO TBEC
81.0000 mg | DELAYED_RELEASE_TABLET | Freq: Every day | ORAL | Status: DC
  Administered 2011-05-28 – 2011-05-30 (×3): 81 mg via ORAL
  Filled 2011-05-28 (×3): qty 1

## 2011-05-28 MED ORDER — FERROUS SULFATE 325 (65 FE) MG PO TABS
325.0000 mg | ORAL_TABLET | Freq: Three times a day (TID) | ORAL | Status: DC
  Administered 2011-05-28 – 2011-05-30 (×7): 325 mg via ORAL
  Filled 2011-05-28 (×9): qty 1

## 2011-05-28 MED ORDER — DIPHENHYDRAMINE HCL 12.5 MG/5ML PO ELIX: 12.5000 mg | ORAL_SOLUTION | ORAL | Status: DC | PRN

## 2011-05-28 MED ORDER — MENTHOL 3 MG MT LOZG
1.0000 | LOZENGE | OROMUCOSAL | Status: DC | PRN
  Filled 2011-05-28: qty 9

## 2011-05-28 MED ORDER — METHOCARBAMOL 500 MG PO TABS: 500.0000 mg | ORAL_TABLET | Freq: Four times a day (QID) | ORAL | Status: DC | PRN

## 2011-05-28 MED ORDER — OMEGA-3-ACID ETHYL ESTERS 1 G PO CAPS
1.0000 g | ORAL_CAPSULE | Freq: Every day | ORAL | Status: DC
  Administered 2011-05-28 – 2011-05-30 (×3): 1 g via ORAL
  Filled 2011-05-28 (×3): qty 1

## 2011-05-28 MED ORDER — PHENOL 1.4 % MT LIQD
1.0000 | OROMUCOSAL | Status: DC | PRN
  Filled 2011-05-28: qty 177

## 2011-05-28 MED ORDER — HYDROCODONE-ACETAMINOPHEN 5-325 MG PO TABS: 1.0000 | ORAL_TABLET | ORAL | Status: DC | PRN

## 2011-05-28 MED ORDER — CALCIUM-VITAMIN D-VITAMIN K 750-500-40 MG-UNT-MCG PO TABS: ORAL_TABLET | Freq: Every day | ORAL | Status: DC

## 2011-05-28 MED ORDER — CEFAZOLIN SODIUM 1-5 GM-% IV SOLN
1.0000 g | Freq: Four times a day (QID) | INTRAVENOUS | Status: AC
Start: 2011-05-28 — End: 2011-05-28
  Administered 2011-05-28 (×3): 1 g via INTRAVENOUS
  Filled 2011-05-28 (×4): qty 50

## 2011-05-28 MED ORDER — METOCLOPRAMIDE HCL 5 MG/ML IJ SOLN
5.0000 mg | Freq: Three times a day (TID) | INTRAMUSCULAR | Status: DC | PRN
  Administered 2011-05-29: 5 mg via INTRAVENOUS
  Filled 2011-05-28: qty 2

## 2011-05-28 MED ORDER — POLYETHYLENE GLYCOL 3350 17 G PO PACK
17.0000 g | PACK | Freq: Two times a day (BID) | ORAL | Status: DC
  Administered 2011-05-28 – 2011-05-30 (×5): 17 g via ORAL
  Filled 2011-05-28 (×6): qty 1

## 2011-05-28 MED ORDER — ACETAMINOPHEN 325 MG PO TABS
650.0000 mg | ORAL_TABLET | Freq: Four times a day (QID) | ORAL | Status: DC | PRN
  Administered 2011-05-28: 650 mg via ORAL
  Filled 2011-05-28: qty 2

## 2011-05-28 NOTE — Progress Notes (Signed)
Subjective: 1 Day Post-Op Procedure(s) (LRB): TOTAL KNEE ARTHROPLASTY (Left)   Patient reports pain as mild. No events.  Objective:   VITALS:   Filed Vitals:   05/28/11 0555  BP: 131/75  Pulse: 76  Temp: 97.8 F (36.6 C)  Resp: 16    Neurovascular intact Dorsiflexion/Plantar flexion intact Incision: dressing C/D/I No cellulitis present Compartment soft  LABS  Basename 05/28/11 0535  HGB 13.0  HCT 37.9  WBC 8.9  PLT 269     Basename 05/28/11 0535  NA 139  K 4.0  BUN 15  CREATININE 0.65  GLUCOSE 125*     Assessment/Plan: 1 Day Post-Op Procedure(s) (LRB): TOTAL KNEE ARTHROPLASTY (Left)   Foley cath D/C'ed HV drain D/C'ed Advance diet Up with therapy D/C IV fluids   Kimberly Donovan. Kimberly Donovan   PAC  05/28/2011, 9:23 AM

## 2011-05-28 NOTE — Progress Notes (Signed)
FL2 in shadow chart for MD signature. SNF search initiated in [facility] . Will provide bed offers as received. Brief psychosocial assessment located in shadow chart. CSW will assist with ST SNF placement.

## 2011-05-28 NOTE — Progress Notes (Signed)
Physical Therapy Treatment Patient Details Name: LATANIA BASCOMB MRN: [id number] DOB: 04-May-1919 Today's Date: 05/28/2011 1325-1341 1te PT Assessment/Plan  PT - Assessment/Plan Comments on Treatment Session: pt progressing well PT Plan: Discharge plan remains appropriate;Frequency remains appropriate PT Frequency: 7X/week Follow Up Recommendations: Skilled nursing facility Equipment Recommended: Defer to next venue PT Goals  Acute Rehab PT Goals  Pt will Perform Home Exercise Program: with supervision, verbal cues required/provided PT Goal: Perform Home Exercise Program - Progress: Progressing toward goal  PT Treatment Precautions/Restrictions  Precautions Precautions: Knee Precaution Comments: no pillow under knee Required Braces or Orthoses: Yes Knee Immobilizer: Discontinue once straight leg raise with < 10 degree lag Restrictions Weight Bearing Restrictions: Yes LLE Weight Bearing: Weight bearing as tolerated Mobility (including Balance) Transfers Exercise  Total Joint Exercises Ankle Circles/Pumps: AROM;Both;10 reps Quad Sets: AROM;10 reps;Supine Short Arc Quad: AROM;Supine;10 reps;AAROM Heel Slides: AROM;AAROM;10 reps;Supine Hip ABduction/ADduction: AROM;AAROM;10 reps;Supine Straight Leg Raises: AROM;AAROM;10 reps;Supine End of Session PT - End of Session Activity Tolerance: Patient tolerated treatment well Patient left: in bed, call bell in reach. Nurse Communication: Mobility status for transfers (RN notified pt put herself back to bed) General Behavior During Session: [facility] for tasks performed Cognition: [facility] for tasks performed  [facility] 05/28/2011, 1:58 PM

## 2011-05-28 NOTE — Progress Notes (Signed)
Physical Therapy Evaluation Patient Details Name: Kimberly Donovan MRN: [id number] DOB: [birth year] Today's Date: 05/28/2011 1000 Ev 2 Problem List:  Patient Active Problem List  Diagnoses  . S/P left knee replacement    Past Medical History:  Past Medical History  Diagnosis Date  . PONV (postoperative nausea and vomiting)   . Arthritis   . Dysrhythmia     states has palpitations-w/u/ neg 2003 per note Dr Donette Larry  . Dyslipidemia   . Dysphagia   . H/O hiatal hernia    Past Surgical History:  Past Surgical History  Procedure Date  . Joint replacement     right total knee  2009  . Tympanoplasty     right with skin grafting   . Carpal tunnel release     bilateral  . Abdominal hysterectomy     with BSO  . Rectocele repair   . Eye surgery     repair of lid left/BILATERAL CATARACT EXTRACTION WITH IOL    PT Assessment/Plan/Recommendation PT Assessment Clinical Impression Statement: pt will benefit from PT to maximize independence for next venue PT Recommendation/Assessment: Patient will need skilled PT in the acute care venue PT Problem List: Decreased strength;Decreased range of motion;Decreased knowledge of use of DME;Decreased activity tolerance;Decreased knowledge of precautions PT Therapy Diagnosis : Difficulty walking PT Plan PT min 7x/wk x 1wk PT Treatment/Interventions: DME instruction;Gait training;Therapeutic activities;Therapeutic exercise;Functional mobility training;Patient/family education PT Recommendation Follow Up Recommendations: Skilled nursing facility ( pt lives alone, prefers a place with a private room) Equipment Recommended: Defer to next venue PT Goals  Acute Rehab PT Goals PT Goal Formulation: With patient Time For Goal Achievement: 6 days Pt will go Supine/Side to Sit: Independently PT Goal: Supine/Side to Sit - Progress: Goal set today Pt will go Sit to Supine/Side: Independently PT Goal: Sit to Supine/Side - Progress: Goal set today Pt will  go Sit to Stand: with supervision PT Goal: Sit to Stand - Progress: Goal set today Pt will go Stand to Sit: with supervision PT Goal: Stand to Sit - Progress: Goal set today Pt will Ambulate: 51 - 150 feet;with supervision;with rolling walker PT Goal: Ambulate - Progress: Goal set today Pt will Perform Home Exercise Program: with supervision, verbal cues required/provided PT Goal: Perform Home Exercise Program - Progress: Goal set today  PT Evaluation Precautions/Restrictions  Precautions Precautions: Knee Precaution Comments: no pillow under knee Required Braces or Orthoses: Yes Knee Immobilizer: Discontinue once straight leg raise with < 10 degree lag Restrictions Weight Bearing Restrictions: Yes LLE Weight Bearing: Weight bearing as tolerated Prior Functioning  Home Living Lives With: Alone Additional Comments: plans to borrow RW; wants 3in1 Prior Function Level of Independence: Independent with gait;Independent with transfers Driving: Yes Cognition Cognition Arousal/Alertness: Awake/alert Overall Cognitive Status: Appears within functional limits for tasks assessed Orientation Level: Oriented X4 Sensation/Coordination   Extremity Assessment RUE Assessment RUE Assessment: Within Functional Limits LUE Assessment LUE Assessment: Within Functional Limits RLE Assessment RLE Assessment: Within Functional Limits (old TKA) LLE Assessment LLE Assessment:  (ankle WFL; able to do SLR)  NO KI in room Mobility (including Balance) Bed Mobility Supine to Sit: 5: Supervision Supine to Sit Details (indicate cue type and reason): for lines, safety Sitting - Scoot to Edge of Bed: 5: Supervision Transfers Sit to Stand: 4: Min assist;From bed;With upper extremity assist Sit to Stand Details (indicate cue type and reason): cues for hand placement Stand to Sit: 4: Min assist;To chair/3-in-1 Stand to Sit Details: cues for hands and LE position;  Ambulation/Gait Ambulation/Gait  Assistance: 4: Min assist Ambulation/Gait Assistance Details (indicate cue type and reason): cues sequence Ambulation Distance (Feet): 30 Feet Assistive device: Rolling walker Gait Pattern: Step-to pattern    Exercise  Total Joint Exercises Ankle Circles/Pumps: AROM;Both;10 reps End of Session PT - End of Session Equipment Utilized During Treatment: Gait belt;Left knee immobilizer Activity Tolerance:  (limited by N/V) General Behavior During Session: [facility] for tasks performed Cognition: [facility] for tasks performed  [facility] 05/28/2011, 10:50 AM

## 2011-05-29 ENCOUNTER — Encounter (HOSPITAL_COMMUNITY): Payer: Self-pay | Admitting: Orthopedic Surgery

## 2011-05-29 LAB — BASIC METABOLIC PANEL
CO2: 24 mEq/L (ref 19–32)
Chloride: 101 mEq/L (ref 96–112)
Glucose, Bld: 137 mg/dL — ABNORMAL HIGH (ref 70–99)
Potassium: 4 mEq/L (ref 3.5–5.1)
Sodium: 135 mEq/L (ref 135–145)

## 2011-05-29 LAB — CBC
Hemoglobin: 11.3 g/dL — ABNORMAL LOW (ref 12.0–15.0)
RBC: 3.67 MIL/uL — ABNORMAL LOW (ref 3.87–5.11)

## 2011-05-29 NOTE — Progress Notes (Signed)
Occupational Therapy Evaluation Patient Details Name: Kimberly Donovan MRN: [id number] DOB: 01-09-1919 Today's Date: 05/29/2011 Time in: 10:34 am Time out: 11:00 am Eval II  Problem List:  Patient Active Problem List  Diagnoses  . S/P left knee replacement    Past Medical History:  Past Medical History  Diagnosis Date  . PONV (postoperative nausea and vomiting)   . Arthritis   . Dysrhythmia     states has palpitations-w/u/ neg 2003 per note Dr Donette Larry  . Dyslipidemia   . Dysphagia   . H/O hiatal hernia    Past Surgical History:  Past Surgical History  Procedure Date  . Joint replacement     right total knee  2009  . Tympanoplasty     right with skin grafting   . Carpal tunnel release     bilateral  . Abdominal hysterectomy     with BSO  . Rectocele repair   . Eye surgery     repair of lid left/BILATERAL CATARACT EXTRACTION WITH IOL    OT Assessment/Plan/Recommendation OT Assessment Clinical Impression Statement: Pt will benefit from skilled OT services to increase her independence level with ADL for next venue of care. OT Recommendation/Assessment: Patient will need skilled OT in the acute care venue OT Problem List: Decreased strength;Decreased knowledge of use of DME or AE;Pain OT Therapy Diagnosis : Generalized weakness OT Plan OT Frequency: Min 1X/week OT Treatment/Interventions: Self-care/ADL training;Therapeutic activities;DME and/or AE instruction;Patient/family education OT Recommendation Follow Up Recommendations: Skilled nursing facility Equipment Recommended: Defer to next venue Individuals Consulted Consulted and Agree with Results and Recommendations: Patient OT Goals Acute Rehab OT Goals OT Goal Formulation: With patient Time For Goal Achievement: 7 days ADL Goals Pt Will Perform Grooming: with supervision;Standing at sink ADL Goal: Grooming - Progress: Goal set today Pt Will Perform Lower Body Bathing: with supervision;Sit to stand from  bed;Sit to stand from chair ADL Goal: Lower Body Bathing - Progress: Goal set today Pt Will Perform Lower Body Dressing: with supervision;Sit to stand from chair;Sit to stand from bed ADL Goal: Lower Body Dressing - Progress: Goal set today Pt Will Transfer to Toilet: with supervision;with DME;Ambulation;3-in-1 ADL Goal: Toilet Transfer - Progress: Goal set today Pt Will Perform Toileting - Clothing Manipulation: with supervision;Standing ADL Goal: Toileting - Clothing Manipulation - Progress: Goal set today Pt Will Perform Toileting - Hygiene: with supervision;Sit to stand from 3-in-1/toilet ADL Goal: Toileting - Hygiene - Progress: Goal set today  OT Evaluation Precautions/Restrictions  Precautions Precautions: Knee Precaution Comments: pt able to do SLR today and  has not had a KI in room; will order if pt pain continues and quad control worsens Required Braces or Orthoses: Yes Knee Immobilizer: Discontinue once straight leg raise with < 10 degree lag Restrictions Weight Bearing Restrictions: Yes LLE Weight Bearing: Weight bearing as tolerated Prior Functioning Home Living Lives With: Alone Additional Comments: plans to borrow RW; wants 3in1 Prior Function Level of Independence: Independent with gait;Independent with transfers;Independent with basic ADLs;Independent with homemaking with ambulation Driving: Yes ADL ADL Eating/Feeding: Simulated;Independent Where Assessed - Eating/Feeding: Chair Grooming: Simulated;Wash/dry hands;Set up Where Assessed - Grooming: Sitting, chair;Unsupported Upper Body Bathing: Performed;Chest;Right arm;Left arm;Abdomen;Supervision/safety;Set up Upper Body Bathing Details (indicate cue type and reason): supervison as patient is groggy and slow processing commands currently. Where Assessed - Upper Body Bathing: Sitting, chair;Unsupported Lower Body Bathing: Performed;Minimal assistance Where Assessed - Lower Body Bathing: Sit to stand from  chair Upper Body Dressing: Simulated;Minimal assistance Where Assessed - Upper Body Dressing: Sitting,  chair;Unsupported Lower Body Dressing: Moderate assistance;Simulated Where Assessed - Lower Body Dressing: Sit to stand from chair Toilet Transfer: Simulated;Minimal assistance Toilet Transfer Details (indicate cue type and reason): sit to stand only aspect Toileting - Clothing Manipulation: Minimal assistance;Simulated Where Assessed - Toileting Clothing Manipulation: Standing Toileting - Hygiene: Simulated;Minimal assistance Where Assessed - Toileting Hygiene: Standing Tub/Shower Transfer: Not assessed Tub/Shower Transfer Method: Not assessed Equipment Used: Rolling walker ADL Comments: pt alittle groggy and slow to process commands at times. She is also HOH. Very pleasant however. Vision/Perception  Vision - History Baseline Vision: Wears glasses all the time Cognition Cognition Arousal/Alertness:  (alittle groggy at times.) Overall Cognitive Status: Difficult to assess (alittle slow to process commands at times.) Sensation/Coordination Sensation Light Touch: Appears Intact Extremity Assessment RUE Assessment RUE Assessment: Within Functional Limits LUE Assessment LUE Assessment: Within Functional Limits Mobility  Transfers Sit to Stand: 4: Min assist;From chair/3-in-1;With upper extremity assist Stand to Sit: 4: Min assist;With upper extremity assist;To chair/3-in-1 Exercises   End of Session OT - End of Session Equipment Utilized During Treatment: Other (comment) (RW) Activity Tolerance: Patient tolerated treatment well;Other (comment) (even though she was alittle slow to process commands at time) Patient left: in chair;with call bell in reach General Behavior During Session: [facility] for tasks performed Cognition:  (alittle slow with commands/needed someinstructions repeated )   Lennox Laity [phone] 05/29/2011, 11:28 AM

## 2011-05-29 NOTE — Progress Notes (Signed)
Subjective: 2 Days Post-Op Procedure(s) (LRB): TOTAL KNEE ARTHROPLASTY (Left)   Patient reports pain as mild. No events.  Objective:   VITALS:   Filed Vitals:   05/29/11 1140  BP: 113/66  Pulse: 66  Temp: 97.7 F (36.5 C)  Resp: 18    Neurovascular intact Dorsiflexion/Plantar flexion intact Incision: dressing C/D/I No cellulitis present Compartment soft  LABS  Basename 05/29/11 0505 05/28/11 0535  HGB 11.3* 13.0  HCT 32.7* 37.9  WBC 9.6 8.9  PLT 253 269     Basename 05/29/11 0505 05/28/11 0535  NA 135 139  K 4.0 4.0  BUN 19 15  CREATININE 0.80 0.65  GLUCOSE 137* 125*     Assessment/Plan: 2 Days Post-Op Procedure(s) (LRB): TOTAL KNEE ARTHROPLASTY (Left)   Up with therapy Approaching discharge to SNF   Kimberly Donovan. Kimberly Donovan   PAC  05/29/2011, 12:52 PM

## 2011-05-29 NOTE — Progress Notes (Signed)
CSW is assisting with d/c planning to SNF. Pt has bed offers and is considering her  choices. Pt will alert CSW when choice is made.

## 2011-05-29 NOTE — Progress Notes (Signed)
Physical Therapy Treatment Patient Details Name: Kimberly Donovan MRN: [id number] DOB: 10/02/1918 Today's Date: 05/29/2011 1191-4782 1 gt 1te PT Assessment/Plan  PT - Assessment/Plan PT Plan: Discharge plan remains appropriate;Frequency remains appropriate PT Frequency: 7X/week Follow Up Recommendations: Skilled nursing facility Equipment Recommended: Defer to next venue PT Goals  Acute Rehab PT Goals PT Goal: Supine/Side to Sit - Progress: Progressing toward goal PT Goal: Sit to Stand - Progress: Progressing toward goal PT Goal: Stand to Sit - Progress: Progressing toward goal PT Goal: Ambulate - Progress: Progressing toward goal PT Goal: Perform Home Exercise Program - Progress: Progressing toward goal  PT Treatment Precautions/Restrictions  Precautions Precautions: Knee Precaution Comments: pt able to do SLR today and  has not had a KI in room; will order if pt pain continues and quad control worsens Required Braces or Orthoses: Yes Knee Immobilizer: Discontinue once straight leg raise with < 10 degree lag Restrictions Weight Bearing Restrictions: Yes LLE Weight Bearing: Weight bearing as tolerated Mobility (including Balance) Bed Mobility Supine to Sit: 4: Min assist Supine to Sit Details (indicate cue type and reason): with UB Sitting - Scoot to Edge of Bed: 5: Supervision Transfers Sit to Stand: 4: Min assist;3: Mod assist;From bed;With upper extremity assist (Simultaneous filing. User may not have seen previous data.) Sit to Stand Details (indicate cue type and reason): cues for hands Stand to Sit: 4: Min assist (Simultaneous filing. User may not have seen previous data.) Stand to Sit Details: cues for hands and LE position; assist for control of descent Ambulation/Gait Ambulation/Gait Assistance: 4: Min assist Ambulation/Gait Assistance Details (indicate cue type and reason): cues for sequence Ambulation Distance (Feet): 40 Feet Assistive device: Rolling walker Gait  Pattern: Step-to pattern;Antalgic    Exercise    End of Session PT - End of Session Equipment Utilized During Treatment: Gait belt;Left knee immobilizer Activity Tolerance: Patient tolerated treatment well;Treatment limited secondary to medical complications (Comment) (pt with nausea and dizziness due to IV dilaudid) Patient left: in chair;with call bell in reach (RN present) General Behavior During Session: [facility] for tasks performed Cognition: [facility] for tasks performed  [facility] 05/29/2011, 11:48 AM

## 2011-05-30 DIAGNOSIS — Z5189 Encounter for other specified aftercare: Secondary | ICD-10-CM | POA: Diagnosis not present

## 2011-05-30 DIAGNOSIS — Z79899 Other long term (current) drug therapy: Secondary | ICD-10-CM | POA: Diagnosis not present

## 2011-05-30 DIAGNOSIS — M1710 Unilateral primary osteoarthritis, unspecified knee: Secondary | ICD-10-CM | POA: Diagnosis not present

## 2011-05-30 DIAGNOSIS — Z96659 Presence of unspecified artificial knee joint: Secondary | ICD-10-CM | POA: Diagnosis not present

## 2011-05-30 DIAGNOSIS — M25569 Pain in unspecified knee: Secondary | ICD-10-CM | POA: Diagnosis not present

## 2011-05-30 DIAGNOSIS — S99929A Unspecified injury of unspecified foot, initial encounter: Secondary | ICD-10-CM | POA: Diagnosis not present

## 2011-05-30 DIAGNOSIS — D62 Acute posthemorrhagic anemia: Secondary | ICD-10-CM | POA: Diagnosis not present

## 2011-05-30 DIAGNOSIS — K5900 Constipation, unspecified: Secondary | ICD-10-CM | POA: Diagnosis not present

## 2011-05-30 MED ORDER — FERROUS SULFATE 325 (65 FE) MG PO TABS: 325.0000 mg | ORAL_TABLET | Freq: Three times a day (TID) | ORAL | Status: DC

## 2011-05-30 MED ORDER — DSS 100 MG PO CAPS: 100.0000 mg | ORAL_CAPSULE | Freq: Two times a day (BID) | ORAL | Status: AC

## 2011-05-30 MED ORDER — TRAMADOL HCL 50 MG PO TABS: 50.0000 mg | ORAL_TABLET | Freq: Four times a day (QID) | ORAL | Status: AC | PRN

## 2011-05-30 MED ORDER — TRAMADOL HCL 50 MG PO TABS: 50.0000 mg | ORAL_TABLET | Freq: Four times a day (QID) | ORAL | Status: DC | PRN

## 2011-05-30 MED ORDER — ASPIRIN EC 325 MG PO TBEC: 325.0000 mg | DELAYED_RELEASE_TABLET | Freq: Two times a day (BID) | ORAL | Status: AC

## 2011-05-30 MED ORDER — POLYETHYLENE GLYCOL 3350 17 G PO PACK
17.0000 g | PACK | Freq: Two times a day (BID) | ORAL | Status: AC
Start: 2011-05-30 — End: 2011-06-02

## 2011-05-30 MED ORDER — KETOROLAC TROMETHAMINE 15 MG/ML IJ SOLN
7.5000 mg | Freq: Four times a day (QID) | INTRAMUSCULAR | Status: DC
  Administered 2011-05-30: 7.5 mg via INTRAVENOUS
  Filled 2011-05-30: qty 1

## 2011-05-30 MED ORDER — FLEET ENEMA 7-19 GM/118ML RE ENEM
1.0000 | ENEMA | Freq: Every day | RECTAL | Status: DC | PRN
Start: 2011-05-30 — End: 2011-05-30
  Administered 2011-05-30: 1 via RECTAL
  Filled 2011-05-30: qty 1

## 2011-05-30 MED ORDER — METHOCARBAMOL 500 MG PO TABS: 500.0000 mg | ORAL_TABLET | Freq: Four times a day (QID) | ORAL | Status: AC | PRN

## 2011-05-30 MED ORDER — ACETAMINOPHEN 325 MG PO TABS: 650.0000 mg | ORAL_TABLET | Freq: Four times a day (QID) | ORAL | Status: AC | PRN

## 2011-05-30 NOTE — Discharge Summary (Signed)
Physician Discharge Summary  Patient ID: Kimberly Donovan MRN: [id number] DOB/AGE: 76/25/1920 76 y.o.  Admit date: 05/27/2011 Discharge date: 05/30/2011  Procedures:  Procedure(s) (LRB): TOTAL KNEE ARTHROPLASTY (Left)  Attending Physician: Shelda Pal, MD   Admission Diagnoses: Left knee OA and pain     Discharge Diagnoses:  Principal Problem:  *S/P left knee replacement  HPI: Pt is a 78 y.o. female complaining of left knee pain for a couple of years. Pain had continually increased since the beginning. History of a previous right TKA about 4-5 year ago. X-rays in the clinic show end-stage arthritic changes of the left knee. Pt has tried various conservative treatments which have failed to alleviate their symptoms including NSAIDs and cortisone injections. Various options are discussed with the patient. Risks, benefits and expectations were discussed with the patient. Patient understand the risks, benefits and expectations and wishes to proceed with surgery.  PCP: No primary provider on file.   Discharged Condition: good  Hospital Course:  Patient underwent the above stated procedure on 05/27/2011. Patient tolerated the procedure well and brought to the recovery room in good condition and subsequently to the floor.  POD #1 BP: 131/75 ; Pulse: 76 ; Temp: 97.8 F (36.6 C) ; Resp: 16  Pt's foley was removed, as well as the hemovac drain removed. IV was changed to a saline lock. Patient reports pain as mild. No events. Neurovascular intact, dorsiflexion/plantar flexion intact, incision: dressing C/D/I, no cellulitis present and compartment soft.  LABS  Basename  05/28/11 0535   HGB  13.0   HCT  37.9    POD #2  BP: 113/66 ; Pulse: 66 ; Temp: 97.7 F (36.5 C) ; Resp: 18  Patient reports pain as mild. No events. Neurovascular intact, dorsiflexion/plantar flexion intact, incision: dressing C/D/I, no cellulitis present and compartment soft.  LABS  Basename  05/29/11 0505    HGB   11.3*    HCT  32.7*     POD #3  BP: 120/60 ; Pulse: 87 ; Temp: 99.5 F (37.5 C) ; Resp: 18  Patient reports pain as moderate. Feels grumpy due to discomfort and decreased mobility. No BM yet. Waiting on SNF for D/C. Neurovascular intact, incision: dressing C/D/I, compartment soft and some knee swelling but appears to be expected  LABS  No new labs  Discharge Exam: Extremities: Homans sign is negative, no sign of DVT, no edema, redness or tenderness in the calves or thighs and no ulcers, gangrene or trophic changes  Disposition: SNF with follow up at [facility] in 2 weeks.  Discharge Orders    Future Orders Please Complete By Expires   Diet - low sodium heart healthy      Call MD / Call 911      Comments:   If you experience chest pain or shortness of breath, CALL 911 and be transported to the hospital emergency room.  If you develope a fever above 101 F, pus (white drainage) or increased drainage or redness at the wound, or calf pain, call your surgeon's office.   Discharge instructions      Comments:   Maintain surgical dressing for 8 days, then replace with gauze and tape. Keep the area dry and clean until follow up. Follow up in 2 weeks at [facility]. Call with any questions or concerns.     Constipation Prevention      Comments:   Drink plenty of fluids.  Prune juice may be helpful.  You may use a  stool softener, such as Colace (over the counter) 100 mg twice a day.  Use MiraLax (over the counter) for constipation as needed.   Increase activity slowly as tolerated      Weight Bearing as taught in Physical Therapy      Comments:   Use a walker or crutches as instructed.   Driving restrictions      Comments:   No driving for 4 weeks   TED hose      Comments:   Use stockings (TED hose) for 2 weeks on both leg(s).  You may remove them at night for sleeping.   Change dressing      Comments:   Maintain surgical dressing for 8 days, then change the  dressing daily with sterile 4 x 4 inch gauze dressing and tape. Keep the area dry and clean.      Current Discharge Medication List    START taking these medications   Details  acetaminophen (TYLENOL) 325 MG tablet Take 2 tablets (650 mg total) by mouth every 6 (six) hours as needed (or Fever >/= 101).    docusate sodium 100 MG CAPS Take 100 mg by mouth 2 (two) times daily.    ferrous sulfate 325 (65 FE) MG tablet Take 1 tablet (325 mg total) by mouth 3 (three) times daily after meals.    methocarbamol (ROBAXIN) 500 MG tablet Take 1 tablet (500 mg total) by mouth every 6 (six) hours as needed. Qty: 50 tablet, Refills: 0    polyethylene glycol (MIRALAX / GLYCOLAX) packet Take 17 g by mouth 2 (two) times daily.    traMADol (ULTRAM) 50 MG tablet Take 1-2 tablets (50-100 mg total) by mouth every 6 (six) hours as needed. Qty: 100 tablet, Refills: 0      CONTINUE these medications which have CHANGED   Details  aspirin EC 325 MG tablet Take 1 tablet (325 mg total) by mouth 2 (two) times daily. Refills: 0   Comments: X 4 weeks      CONTINUE these medications which have NOT CHANGED   Details  Calcium-Vitamin D-Vitamin K (CALCIUM + D + K PO) Take 1 tablet by mouth daily.     fish oil-omega-3 fatty acids 1000 MG capsule Take 1 g by mouth daily.     !! Multiple Vitamin (MULITIVITAMIN WITH MINERALS) TABS Take 1 tablet by mouth daily.     !! Multiple Vitamins-Minerals (PRESERVISION AREDS PO) Take 1 tablet by mouth daily.     TURMERIC PO Take 1 tablet by mouth daily.      !! - Potential duplicate medications found. Please discuss with provider.    STOP taking these medications     naproxen sodium (ANAPROX) 220 MG tablet Comments:  Reason for Stopping:            Signed: Anastasio Auerbach. Auren Valdes   PAC  05/30/2011, 10:51 AM

## 2011-05-30 NOTE — Progress Notes (Signed)
Patient ID: Kimberly Donovan, female   DOB: 02-15-1919, 78 y.o.   MRN: [id number] Subjective: 3 Days Post-Op Procedure(s) (LRB): TOTAL KNEE ARTHROPLASTY (Left)    Patient reports pain as moderate. Feels grumpy due to discomfort and decreased mobility No BM yet Would like to shower Waiting on SNF for D/C  Objective:   VITALS:   Filed Vitals:   05/30/11 0530  BP: 120/60  Pulse: 87  Temp: 99.5 F (37.5 C)  Resp: 18    Neurovascular intact Incision: dressing C/D/I Compartment soft Some knee swelling but appears to be expected  LABS  Basename 05/29/11 0505 05/28/11 0535  HGB 11.3* 13.0  HCT 32.7* 37.9  WBC 9.6 8.9  PLT 253 269     Basename 05/29/11 0505 05/28/11 0535  NA 135 139  K 4.0 4.0  BUN 19 15  CREATININE 0.80 0.65  GLUCOSE 137* 125*    No results found for this basename: LABPT:2,INR:2 in the last 72 hours   Assessment/Plan: 3 Days Post-Op Procedure(s) (LRB): TOTAL KNEE ARTHROPLASTY (Left)   Up with therapy Discharge to SNF May shower today Prior to d/c will try to help with discomfort with low dose toradol Change to tramadol to encourage use of pain meds to do therapy but reduce risk of nausea To SNF today, wants [facility], due to proximity to home

## 2011-05-30 NOTE — Progress Notes (Signed)
Patient set to discharge to [facility] today. Daughter, Jeanice Lim made aware & patient completed admission paperwork @ bedside. [facility] called for transportation.   Unice Bailey, LCSWA [phone]

## 2011-05-30 NOTE — Progress Notes (Signed)
Fleets enema given as ordered pt had good results.

## 2011-06-05 DIAGNOSIS — Z79899 Other long term (current) drug therapy: Secondary | ICD-10-CM | POA: Diagnosis not present

## 2011-06-05 DIAGNOSIS — K5900 Constipation, unspecified: Secondary | ICD-10-CM | POA: Diagnosis not present

## 2011-06-05 DIAGNOSIS — D62 Acute posthemorrhagic anemia: Secondary | ICD-10-CM | POA: Diagnosis not present

## 2011-06-12 DIAGNOSIS — Z471 Aftercare following joint replacement surgery: Secondary | ICD-10-CM | POA: Diagnosis not present

## 2011-06-12 DIAGNOSIS — M1710 Unilateral primary osteoarthritis, unspecified knee: Secondary | ICD-10-CM | POA: Diagnosis not present

## 2011-06-12 DIAGNOSIS — I499 Cardiac arrhythmia, unspecified: Secondary | ICD-10-CM | POA: Diagnosis not present

## 2011-06-12 DIAGNOSIS — IMO0001 Reserved for inherently not codable concepts without codable children: Secondary | ICD-10-CM | POA: Diagnosis not present

## 2011-06-12 DIAGNOSIS — Z96659 Presence of unspecified artificial knee joint: Secondary | ICD-10-CM | POA: Diagnosis not present

## 2011-06-12 DIAGNOSIS — R1310 Dysphagia, unspecified: Secondary | ICD-10-CM | POA: Diagnosis not present

## 2011-06-17 DIAGNOSIS — Z471 Aftercare following joint replacement surgery: Secondary | ICD-10-CM | POA: Diagnosis not present

## 2011-06-17 DIAGNOSIS — IMO0001 Reserved for inherently not codable concepts without codable children: Secondary | ICD-10-CM | POA: Diagnosis not present

## 2011-06-17 DIAGNOSIS — Z96659 Presence of unspecified artificial knee joint: Secondary | ICD-10-CM | POA: Diagnosis not present

## 2011-06-17 DIAGNOSIS — I499 Cardiac arrhythmia, unspecified: Secondary | ICD-10-CM | POA: Diagnosis not present

## 2011-06-17 DIAGNOSIS — R1310 Dysphagia, unspecified: Secondary | ICD-10-CM | POA: Diagnosis not present

## 2011-06-18 DIAGNOSIS — Z96659 Presence of unspecified artificial knee joint: Secondary | ICD-10-CM | POA: Diagnosis not present

## 2011-06-18 DIAGNOSIS — Z471 Aftercare following joint replacement surgery: Secondary | ICD-10-CM | POA: Diagnosis not present

## 2011-06-18 DIAGNOSIS — I499 Cardiac arrhythmia, unspecified: Secondary | ICD-10-CM | POA: Diagnosis not present

## 2011-06-18 DIAGNOSIS — IMO0001 Reserved for inherently not codable concepts without codable children: Secondary | ICD-10-CM | POA: Diagnosis not present

## 2011-06-18 DIAGNOSIS — R1310 Dysphagia, unspecified: Secondary | ICD-10-CM | POA: Diagnosis not present

## 2011-06-19 DIAGNOSIS — Z471 Aftercare following joint replacement surgery: Secondary | ICD-10-CM | POA: Diagnosis not present

## 2011-06-19 DIAGNOSIS — IMO0001 Reserved for inherently not codable concepts without codable children: Secondary | ICD-10-CM | POA: Diagnosis not present

## 2011-06-19 DIAGNOSIS — R1310 Dysphagia, unspecified: Secondary | ICD-10-CM | POA: Diagnosis not present

## 2011-06-19 DIAGNOSIS — Z96659 Presence of unspecified artificial knee joint: Secondary | ICD-10-CM | POA: Diagnosis not present

## 2011-06-19 DIAGNOSIS — I499 Cardiac arrhythmia, unspecified: Secondary | ICD-10-CM | POA: Diagnosis not present

## 2011-06-20 DIAGNOSIS — I499 Cardiac arrhythmia, unspecified: Secondary | ICD-10-CM | POA: Diagnosis not present

## 2011-06-20 DIAGNOSIS — Z96659 Presence of unspecified artificial knee joint: Secondary | ICD-10-CM | POA: Diagnosis not present

## 2011-06-20 DIAGNOSIS — Z471 Aftercare following joint replacement surgery: Secondary | ICD-10-CM | POA: Diagnosis not present

## 2011-06-20 DIAGNOSIS — IMO0001 Reserved for inherently not codable concepts without codable children: Secondary | ICD-10-CM | POA: Diagnosis not present

## 2011-06-20 DIAGNOSIS — R1310 Dysphagia, unspecified: Secondary | ICD-10-CM | POA: Diagnosis not present

## 2011-06-23 DIAGNOSIS — I499 Cardiac arrhythmia, unspecified: Secondary | ICD-10-CM | POA: Diagnosis not present

## 2011-06-23 DIAGNOSIS — IMO0001 Reserved for inherently not codable concepts without codable children: Secondary | ICD-10-CM | POA: Diagnosis not present

## 2011-06-23 DIAGNOSIS — R1310 Dysphagia, unspecified: Secondary | ICD-10-CM | POA: Diagnosis not present

## 2011-06-23 DIAGNOSIS — Z471 Aftercare following joint replacement surgery: Secondary | ICD-10-CM | POA: Diagnosis not present

## 2011-06-23 DIAGNOSIS — Z96659 Presence of unspecified artificial knee joint: Secondary | ICD-10-CM | POA: Diagnosis not present

## 2011-06-24 DIAGNOSIS — IMO0001 Reserved for inherently not codable concepts without codable children: Secondary | ICD-10-CM | POA: Diagnosis not present

## 2011-06-24 DIAGNOSIS — I499 Cardiac arrhythmia, unspecified: Secondary | ICD-10-CM | POA: Diagnosis not present

## 2011-06-24 DIAGNOSIS — Z471 Aftercare following joint replacement surgery: Secondary | ICD-10-CM | POA: Diagnosis not present

## 2011-06-24 DIAGNOSIS — Z96659 Presence of unspecified artificial knee joint: Secondary | ICD-10-CM | POA: Diagnosis not present

## 2011-06-24 DIAGNOSIS — R1310 Dysphagia, unspecified: Secondary | ICD-10-CM | POA: Diagnosis not present

## 2011-06-25 DIAGNOSIS — Z96659 Presence of unspecified artificial knee joint: Secondary | ICD-10-CM | POA: Diagnosis not present

## 2011-06-25 DIAGNOSIS — R1310 Dysphagia, unspecified: Secondary | ICD-10-CM | POA: Diagnosis not present

## 2011-06-25 DIAGNOSIS — IMO0001 Reserved for inherently not codable concepts without codable children: Secondary | ICD-10-CM | POA: Diagnosis not present

## 2011-06-25 DIAGNOSIS — Z471 Aftercare following joint replacement surgery: Secondary | ICD-10-CM | POA: Diagnosis not present

## 2011-06-25 DIAGNOSIS — I499 Cardiac arrhythmia, unspecified: Secondary | ICD-10-CM | POA: Diagnosis not present

## 2011-06-26 DIAGNOSIS — I499 Cardiac arrhythmia, unspecified: Secondary | ICD-10-CM | POA: Diagnosis not present

## 2011-06-26 DIAGNOSIS — Z471 Aftercare following joint replacement surgery: Secondary | ICD-10-CM | POA: Diagnosis not present

## 2011-06-26 DIAGNOSIS — IMO0001 Reserved for inherently not codable concepts without codable children: Secondary | ICD-10-CM | POA: Diagnosis not present

## 2011-06-26 DIAGNOSIS — Z96659 Presence of unspecified artificial knee joint: Secondary | ICD-10-CM | POA: Diagnosis not present

## 2011-06-26 DIAGNOSIS — R1310 Dysphagia, unspecified: Secondary | ICD-10-CM | POA: Diagnosis not present

## 2011-06-27 DIAGNOSIS — Z471 Aftercare following joint replacement surgery: Secondary | ICD-10-CM | POA: Diagnosis not present

## 2011-06-27 DIAGNOSIS — Z96659 Presence of unspecified artificial knee joint: Secondary | ICD-10-CM | POA: Diagnosis not present

## 2011-06-27 DIAGNOSIS — IMO0001 Reserved for inherently not codable concepts without codable children: Secondary | ICD-10-CM | POA: Diagnosis not present

## 2011-06-27 DIAGNOSIS — R1310 Dysphagia, unspecified: Secondary | ICD-10-CM | POA: Diagnosis not present

## 2011-06-27 DIAGNOSIS — I499 Cardiac arrhythmia, unspecified: Secondary | ICD-10-CM | POA: Diagnosis not present

## 2011-07-17 DIAGNOSIS — M1710 Unilateral primary osteoarthritis, unspecified knee: Secondary | ICD-10-CM | POA: Diagnosis not present

## 2011-09-29 DIAGNOSIS — M1710 Unilateral primary osteoarthritis, unspecified knee: Secondary | ICD-10-CM | POA: Diagnosis not present

## 2011-09-29 DIAGNOSIS — Z0000 Encounter for general adult medical examination without abnormal findings: Secondary | ICD-10-CM | POA: Diagnosis not present

## 2011-09-29 DIAGNOSIS — E7800 Pure hypercholesterolemia, unspecified: Secondary | ICD-10-CM | POA: Diagnosis not present

## 2011-09-29 DIAGNOSIS — Q393 Congenital stenosis and stricture of esophagus: Secondary | ICD-10-CM | POA: Diagnosis not present

## 2011-09-29 DIAGNOSIS — Z1331 Encounter for screening for depression: Secondary | ICD-10-CM | POA: Diagnosis not present

## 2011-09-29 DIAGNOSIS — Q391 Atresia of esophagus with tracheo-esophageal fistula: Secondary | ICD-10-CM | POA: Diagnosis not present

## 2012-02-29 DIAGNOSIS — Z23 Encounter for immunization: Secondary | ICD-10-CM | POA: Diagnosis not present

## 2012-05-18 DIAGNOSIS — H3530 Unspecified macular degeneration: Secondary | ICD-10-CM | POA: Diagnosis not present

## 2012-05-18 DIAGNOSIS — H52209 Unspecified astigmatism, unspecified eye: Secondary | ICD-10-CM | POA: Diagnosis not present

## 2012-05-18 DIAGNOSIS — Z961 Presence of intraocular lens: Secondary | ICD-10-CM | POA: Diagnosis not present

## 2012-07-21 DIAGNOSIS — M25579 Pain in unspecified ankle and joints of unspecified foot: Secondary | ICD-10-CM | POA: Diagnosis not present

## 2012-07-21 DIAGNOSIS — Z96659 Presence of unspecified artificial knee joint: Secondary | ICD-10-CM | POA: Diagnosis not present

## 2012-07-28 DIAGNOSIS — R42 Dizziness and giddiness: Secondary | ICD-10-CM | POA: Diagnosis not present

## 2012-07-28 DIAGNOSIS — S96819A Strain of other specified muscles and tendons at ankle and foot level, unspecified foot, initial encounter: Secondary | ICD-10-CM | POA: Diagnosis not present

## 2012-07-28 DIAGNOSIS — S93499A Sprain of other ligament of unspecified ankle, initial encounter: Secondary | ICD-10-CM | POA: Diagnosis not present

## 2012-07-28 DIAGNOSIS — R002 Palpitations: Secondary | ICD-10-CM | POA: Diagnosis not present

## 2012-08-02 DIAGNOSIS — R42 Dizziness and giddiness: Secondary | ICD-10-CM | POA: Diagnosis not present

## 2012-08-02 DIAGNOSIS — E7800 Pure hypercholesterolemia, unspecified: Secondary | ICD-10-CM | POA: Diagnosis not present

## 2012-08-02 DIAGNOSIS — R002 Palpitations: Secondary | ICD-10-CM | POA: Diagnosis not present

## 2012-08-02 DIAGNOSIS — M779 Enthesopathy, unspecified: Secondary | ICD-10-CM | POA: Diagnosis not present

## 2012-08-02 DIAGNOSIS — Z01810 Encounter for preprocedural cardiovascular examination: Secondary | ICD-10-CM | POA: Diagnosis not present

## 2012-08-03 DIAGNOSIS — R42 Dizziness and giddiness: Secondary | ICD-10-CM | POA: Diagnosis not present

## 2012-08-03 DIAGNOSIS — R002 Palpitations: Secondary | ICD-10-CM | POA: Diagnosis not present

## 2012-08-03 DIAGNOSIS — E7800 Pure hypercholesterolemia, unspecified: Secondary | ICD-10-CM | POA: Diagnosis not present

## 2012-08-03 DIAGNOSIS — Z01810 Encounter for preprocedural cardiovascular examination: Secondary | ICD-10-CM | POA: Diagnosis not present

## 2012-08-09 ENCOUNTER — Encounter (HOSPITAL_BASED_OUTPATIENT_CLINIC_OR_DEPARTMENT_OTHER): Payer: Self-pay | Admitting: *Deleted

## 2012-08-09 NOTE — Progress Notes (Signed)
Pt saw dr Anne Fu for clearance for surg-echo done-hx tachycardia-no meds-very healthy and bright-had total knee 1/13 did well- She was an avid golfer and used to play with my mom. Will need istat-cannot drive with boot on

## 2012-08-11 ENCOUNTER — Other Ambulatory Visit: Payer: Self-pay | Admitting: Orthopedic Surgery

## 2012-08-12 ENCOUNTER — Ambulatory Visit (HOSPITAL_BASED_OUTPATIENT_CLINIC_OR_DEPARTMENT_OTHER): Payer: Medicare Other | Admitting: Anesthesiology

## 2012-08-12 ENCOUNTER — Encounter (HOSPITAL_BASED_OUTPATIENT_CLINIC_OR_DEPARTMENT_OTHER)
Admission: RE | Disposition: A | Discharge status: Home / Self Care | Payer: Self-pay | Source: Ambulatory Visit | Attending: Orthopedic Surgery

## 2012-08-12 ENCOUNTER — Encounter (HOSPITAL_BASED_OUTPATIENT_CLINIC_OR_DEPARTMENT_OTHER): Payer: Self-pay | Admitting: Certified Registered Nurse Anesthetist

## 2012-08-12 ENCOUNTER — Encounter (HOSPITAL_BASED_OUTPATIENT_CLINIC_OR_DEPARTMENT_OTHER): Payer: Self-pay | Admitting: Anesthesiology

## 2012-08-12 ENCOUNTER — Ambulatory Visit (HOSPITAL_BASED_OUTPATIENT_CLINIC_OR_DEPARTMENT_OTHER)
Admission: RE | Admit: 2012-08-12 | Discharge: 2012-08-12 | Disposition: A | Discharge status: Home / Self Care | Payer: Medicare Other | Source: Ambulatory Visit | Attending: Orthopedic Surgery | Admitting: Orthopedic Surgery

## 2012-08-12 DIAGNOSIS — I499 Cardiac arrhythmia, unspecified: Secondary | ICD-10-CM | POA: Insufficient documentation

## 2012-08-12 DIAGNOSIS — E785 Hyperlipidemia, unspecified: Secondary | ICD-10-CM | POA: Insufficient documentation

## 2012-08-12 DIAGNOSIS — Z79899 Other long term (current) drug therapy: Secondary | ICD-10-CM | POA: Diagnosis not present

## 2012-08-12 DIAGNOSIS — IMO0002 Reserved for concepts with insufficient information to code with codable children: Secondary | ICD-10-CM | POA: Insufficient documentation

## 2012-08-12 DIAGNOSIS — M129 Arthropathy, unspecified: Secondary | ICD-10-CM | POA: Diagnosis not present

## 2012-08-12 DIAGNOSIS — X58XXXA Exposure to other specified factors, initial encounter: Secondary | ICD-10-CM | POA: Insufficient documentation

## 2012-08-12 DIAGNOSIS — M6240 Contracture of muscle, unspecified site: Secondary | ICD-10-CM | POA: Diagnosis not present

## 2012-08-12 DIAGNOSIS — S93499A Sprain of other ligament of unspecified ankle, initial encounter: Secondary | ICD-10-CM | POA: Diagnosis not present

## 2012-08-12 DIAGNOSIS — M66871 Spontaneous rupture of other tendons, right ankle and foot: Secondary | ICD-10-CM

## 2012-08-12 DIAGNOSIS — M66879 Spontaneous rupture of other tendons, unspecified ankle and foot: Secondary | ICD-10-CM | POA: Diagnosis not present

## 2012-08-12 DIAGNOSIS — G8918 Other acute postprocedural pain: Secondary | ICD-10-CM | POA: Diagnosis not present

## 2012-08-12 DIAGNOSIS — M25579 Pain in unspecified ankle and joints of unspecified foot: Secondary | ICD-10-CM | POA: Diagnosis not present

## 2012-08-12 HISTORY — PX: TENDON REPAIR: SHX5111

## 2012-08-12 HISTORY — DX: Unspecified hearing loss, unspecified ear: H91.90

## 2012-08-12 HISTORY — DX: Presence of external hearing-aid: Z97.4

## 2012-08-12 LAB — POCT I-STAT, CHEM 8
Calcium, Ion: 1.16 mmol/L (ref 1.13–1.30)
Chloride: 108 mEq/L (ref 96–112)
Creatinine, Ser: 0.8 mg/dL (ref 0.50–1.10)
Glucose, Bld: 95 mg/dL (ref 70–99)
HCT: 43 % (ref 36.0–46.0)

## 2012-08-12 SURGERY — TENDON REPAIR
Anesthesia: Regional | Site: Foot | Laterality: Right | Wound class: Clean

## 2012-08-12 MED ORDER — HYDROCODONE-ACETAMINOPHEN 5-325 MG PO TABS: 1.0000 | ORAL_TABLET | Freq: Four times a day (QID) | ORAL | Status: DC | PRN

## 2012-08-12 MED ORDER — HYDROMORPHONE HCL PF 1 MG/ML IJ SOLN: 0.2500 mg | INTRAMUSCULAR | Status: DC | PRN

## 2012-08-12 MED ORDER — LIDOCAINE HCL (CARDIAC) 20 MG/ML IV SOLN
INTRAVENOUS | Status: DC | PRN
  Administered 2012-08-12 (×2): 30 mg via INTRAVENOUS

## 2012-08-12 MED ORDER — LACTATED RINGERS IV SOLN
INTRAVENOUS | Status: DC
  Administered 2012-08-12: 11:00:00 via INTRAVENOUS

## 2012-08-12 MED ORDER — PROPOFOL 10 MG/ML IV BOLUS
INTRAVENOUS | Status: DC | PRN
  Administered 2012-08-12: 100 mg via INTRAVENOUS

## 2012-08-12 MED ORDER — BACITRACIN ZINC 500 UNIT/GM EX OINT
TOPICAL_OINTMENT | CUTANEOUS | Status: DC | PRN
  Administered 2012-08-12: 1 via TOPICAL

## 2012-08-12 MED ORDER — CHLORHEXIDINE GLUCONATE 4 % EX LIQD: 60.0000 mL | Freq: Once | CUTANEOUS | Status: DC

## 2012-08-12 MED ORDER — PROPOFOL INFUSION 10 MG/ML OPTIME
INTRAVENOUS | Status: DC | PRN
  Administered 2012-08-12: 100 ug/kg/min via INTRAVENOUS

## 2012-08-12 MED ORDER — ROPIVACAINE HCL 5 MG/ML IJ SOLN
INTRAMUSCULAR | Status: DC | PRN
  Administered 2012-08-12: 40 mL via EPIDURAL

## 2012-08-12 MED ORDER — MIDAZOLAM HCL 2 MG/2ML IJ SOLN: 1.0000 mg | INTRAMUSCULAR | Status: DC | PRN

## 2012-08-12 MED ORDER — CEFAZOLIN SODIUM-DEXTROSE 2-3 GM-% IV SOLR
2.0000 g | INTRAVENOUS | Status: AC
  Administered 2012-08-12: 2 g via INTRAVENOUS

## 2012-08-12 MED ORDER — SODIUM CHLORIDE 0.9 % IV SOLN: INTRAVENOUS | Status: DC

## 2012-08-12 MED ORDER — FENTANYL CITRATE 0.05 MG/ML IJ SOLN
50.0000 ug | INTRAMUSCULAR | Status: DC | PRN
  Administered 2012-08-12: 50 ug via INTRAVENOUS

## 2012-08-12 SURGICAL SUPPLY — 76 items
BANDAGE ESMARK 6X9 LF (GAUZE/BANDAGES/DRESSINGS) ×1 IMPLANT
BLADE AVERAGE 25X9 (BLADE) IMPLANT
BLADE SURG 15 STRL LF DISP TIS (BLADE) ×2 IMPLANT
BLADE SURG 15 STRL SS (BLADE) ×4
BNDG CMPR 9X4 STRL LF SNTH (GAUZE/BANDAGES/DRESSINGS)
BNDG CMPR 9X6 STRL LF SNTH (GAUZE/BANDAGES/DRESSINGS) ×1
BNDG COHESIVE 4X5 TAN STRL (GAUZE/BANDAGES/DRESSINGS) IMPLANT
BNDG COHESIVE 6X5 TAN STRL LF (GAUZE/BANDAGES/DRESSINGS) IMPLANT
BNDG ESMARK 4X9 LF (GAUZE/BANDAGES/DRESSINGS) IMPLANT
BNDG ESMARK 6X9 LF (GAUZE/BANDAGES/DRESSINGS) ×2
CHLORAPREP W/TINT 26ML (MISCELLANEOUS) ×2 IMPLANT
COVER TABLE BACK 60X90 (DRAPES) ×2 IMPLANT
CUFF TOURNIQUET SINGLE 34IN LL (TOURNIQUET CUFF) ×2 IMPLANT
DECANTER SPIKE VIAL GLASS SM (MISCELLANEOUS) IMPLANT
DRAPE C-ARM 42X72 X-RAY (DRAPES) IMPLANT
DRAPE C-ARMOR (DRAPES) IMPLANT
DRAPE EXTREMITY T 121X128X90 (DRAPE) ×2 IMPLANT
DRAPE INCISE IOBAN 66X45 STRL (DRAPES) IMPLANT
DRAPE OEC MINIVIEW 54X84 (DRAPES) IMPLANT
DRAPE U-SHAPE 47X51 STRL (DRAPES) ×1 IMPLANT
DRSG EMULSION OIL 3X3 NADH (GAUZE/BANDAGES/DRESSINGS) ×2 IMPLANT
DRSG PAD ABDOMINAL 8X10 ST (GAUZE/BANDAGES/DRESSINGS) ×4 IMPLANT
ELECT REM PT RETURN 9FT ADLT (ELECTROSURGICAL) ×2
ELECTRODE REM PT RTRN 9FT ADLT (ELECTROSURGICAL) ×1 IMPLANT
GLOVE BIO SURGEON STRL SZ7 (GLOVE) ×1 IMPLANT
GLOVE BIO SURGEON STRL SZ8 (GLOVE) ×2 IMPLANT
GLOVE BIOGEL PI IND STRL 7.0 (GLOVE) IMPLANT
GLOVE BIOGEL PI IND STRL 8 (GLOVE) ×1 IMPLANT
GLOVE BIOGEL PI INDICATOR 7.0 (GLOVE) ×2
GLOVE BIOGEL PI INDICATOR 8 (GLOVE) ×1
GLOVE ECLIPSE 6.5 STRL STRAW (GLOVE) ×2 IMPLANT
GLOVE EXAM NITRILE MD LF STRL (GLOVE) IMPLANT
GOWN PREVENTION PLUS XLARGE (GOWN DISPOSABLE) ×2 IMPLANT
GOWN PREVENTION PLUS XXLARGE (GOWN DISPOSABLE) ×3 IMPLANT
KIT BIO-TENODESIS 3X8 DISP (MISCELLANEOUS)
KIT INSRT BABSR STRL DISP BTN (MISCELLANEOUS) IMPLANT
KWIRE 4.0 X .062IN (WIRE) IMPLANT
NDL SUT 6 .5 CRC .975X.05 MAYO (NEEDLE) IMPLANT
NEEDLE HYPO 22GX1.5 SAFETY (NEEDLE) IMPLANT
NEEDLE MAYO TAPER (NEEDLE)
NS IRRIG 1000ML POUR BTL (IV SOLUTION) ×2 IMPLANT
PACK BASIN DAY SURGERY FS (CUSTOM PROCEDURE TRAY) ×2 IMPLANT
PAD CAST 4YDX4 CTTN HI CHSV (CAST SUPPLIES) ×1 IMPLANT
PADDING CAST ABS 4INX4YD NS (CAST SUPPLIES)
PADDING CAST ABS COTTON 4X4 ST (CAST SUPPLIES) IMPLANT
PADDING CAST COTTON 4X4 STRL (CAST SUPPLIES) ×2
PADDING CAST COTTON 6X4 STRL (CAST SUPPLIES) ×2 IMPLANT
PASSER SUT SWANSON 36MM LOOP (INSTRUMENTS) IMPLANT
PENCIL BUTTON HOLSTER BLD 10FT (ELECTRODE) ×2 IMPLANT
SANITIZER HAND PURELL 535ML FO (MISCELLANEOUS) ×2 IMPLANT
SCOTCHCAST PLUS 4X4 WHITE (CAST SUPPLIES) ×3 IMPLANT
SHEET MEDIUM DRAPE 40X70 STRL (DRAPES) ×2 IMPLANT
SLEEVE SCD COMPRESS KNEE MED (MISCELLANEOUS) ×2 IMPLANT
SPLINT FAST PLASTER 5X30 (CAST SUPPLIES)
SPLINT PLASTER CAST FAST 5X30 (CAST SUPPLIES) IMPLANT
SPONGE GAUZE 4X4 12PLY (GAUZE/BANDAGES/DRESSINGS) ×2 IMPLANT
SPONGE LAP 18X18 X RAY DECT (DISPOSABLE) ×2 IMPLANT
STOCKINETTE 6  STRL (DRAPES) ×1
STOCKINETTE 6 STRL (DRAPES) ×1 IMPLANT
SUCTION FRAZIER TIP 10 FR DISP (SUCTIONS) IMPLANT
SUT ETHIBOND 0 MO6 C/R (SUTURE) ×1 IMPLANT
SUT ETHIBOND 2 OS 4 DA (SUTURE) IMPLANT
SUT ETHILON 3 0 PS 1 (SUTURE) ×2 IMPLANT
SUT FIBERWIRE 2-0 18 17.9 3/8 (SUTURE)
SUT MERSILENE 2.0 SH NDLE (SUTURE) IMPLANT
SUT MNCRL AB 3-0 PS2 18 (SUTURE) ×3 IMPLANT
SUT MNCRL AB 4-0 PS2 18 (SUTURE) ×1 IMPLANT
SUT VIC AB 0 SH 27 (SUTURE) ×1 IMPLANT
SUT VIC AB 2-0 SH 18 (SUTURE) IMPLANT
SUT VIC AB 2-0 SH 27 (SUTURE)
SUT VIC AB 2-0 SH 27XBRD (SUTURE) IMPLANT
SUT VICRYL 4-0 PS2 18IN ABS (SUTURE) IMPLANT
SUTURE FIBERWR 2-0 18 17.9 3/8 (SUTURE) IMPLANT
SYR BULB 3OZ (MISCELLANEOUS) ×2 IMPLANT
TUBE CONNECTING 20X1/4 (TUBING) IMPLANT
UNDERPAD 30X30 INCONTINENT (UNDERPADS AND DIAPERS) ×2 IMPLANT

## 2012-08-12 NOTE — Transfer of Care (Signed)
Immediate Anesthesia Transfer of Care Note  Patient: Kimberly Donovan  Procedure(s) Performed: Procedure(s): RIGHT TIB AND REPAIR VS RECONSTRUCTION POSSIBLE EHL TRANSFER AND GASTROC RECESSION (Right)  Patient Location: PACU  Anesthesia Type:GA combined with regional for post-op pain  Level of Consciousness: awake, alert , oriented and patient cooperative  Airway & Oxygen Therapy: Patient Spontanous Breathing and Patient connected to face mask oxygen  Post-op Assessment: Report given to PACU RN and Post -op Vital signs reviewed and stable  Post vital signs: Reviewed and stable  Complications: No apparent anesthesia complications

## 2012-08-12 NOTE — Anesthesia Postprocedure Evaluation (Signed)
  Anesthesia Post-op Note  Patient: Kimberly Donovan  Procedure(s) Performed: Procedure(s): RIGHT TIB AND REPAIR VS RECONSTRUCTION POSSIBLE EHL TRANSFER AND GASTROC RECESSION (Right)  Patient Location: PACU  Anesthesia Type:GA combined with regional for post-op pain  Level of Consciousness: awake, alert  and oriented  Airway and Oxygen Therapy: Patient Spontanous Breathing  Post-op Pain: none  Post-op Assessment: Post-op Vital signs reviewed  Post-op Vital Signs: Reviewed  Complications: No apparent anesthesia complications

## 2012-08-12 NOTE — Progress Notes (Signed)
Assisted Dr. Fitzgerald with right, ultrasound guided, popliteal/saphenous block. Side rails up, monitors on throughout procedure. See vital signs in flow sheet. Tolerated Procedure well. 

## 2012-08-12 NOTE — Anesthesia Procedure Notes (Addendum)
Anesthesia Regional Block:  Popliteal block  Pre-Anesthetic Checklist: ,, timeout performed, Correct Patient, Correct Site, Correct Laterality, Correct Procedure, Correct Position, site marked, Risks and benefits discussed, pre-op evaluation, post-op pain management  Laterality: Right  Prep: Maximum Sterile Barrier Precautions used and chloraprep       Needles:  Injection technique: Single-shot  Needle Type: Echogenic Stimulator Needle          Additional Needles:  Procedures: ultrasound guided (picture in chart) and nerve stimulator Popliteal block  Nerve Stimulator or Paresthesia:  Response: Peroneal, 0.4 mA,  Response: Tibial, 0.4 mA,   Additional Responses:   Narrative:  Start time: 08/12/2012 11:28 AM End time: 08/12/2012 11:40 AM Injection made incrementally with aspirations every 5 mL. Anesthesiologist: Sampson Goon, MD  Additional Notes: 2% Lidocaine skin wheel. Saphenous block with 10cc of 0.5% Ropivicaine plain.  Popliteal block Procedure Name: LMA Insertion Date/Time: 08/12/2012 11:57 AM Performed by: Bless Lisenby D Pre-anesthesia Checklist: Patient identified, Emergency Drugs available, Suction available and Patient being monitored Patient Re-evaluated:Patient Re-evaluated prior to inductionOxygen Delivery Method: Circle System Utilized Preoxygenation: Pre-oxygenation with 100% oxygen Intubation Type: IV induction Ventilation: Mask ventilation without difficulty LMA: LMA inserted LMA Size: 4.0 Number of attempts: 1 Airway Equipment and Method: bite block Placement Confirmation: positive ETCO2 Tube secured with: Tape Dental Injury: Teeth and Oropharynx as per pre-operative assessment

## 2012-08-12 NOTE — H&P (Signed)
Kimberly Donovan is an 79 y.o. female.   Chief Complaint: right tibialis anterior rupture HPI: 79 y/o female with rupture of her right tibialis anterior and resultant foot drop.  Past Medical History  Diagnosis Date  . PONV (postoperative nausea and vomiting)   . Arthritis   . Dysrhythmia     states has palpitations-w/u/ neg 2003 per note Dr Donette Larry  . Dyslipidemia   . Dysphagia   . HOH (hard of hearing)   . Wears hearing aid     both ears  . H/O hiatal hernia     no meds    Past Surgical History  Procedure Laterality Date  . Joint replacement      right total knee  2009  . Tympanoplasty      right with skin grafting   . Carpal tunnel release      bilateral  . Abdominal hysterectomy      with BSO  . Rectocele repair    . Eye surgery      repair of lid left/BILATERAL CATARACT EXTRACTION WITH IOL  . Total knee arthroplasty  05/27/2011    Procedure: TOTAL KNEE ARTHROPLASTY;  Surgeon: Shelda Pal, MD;  Location: [facility];  Service: Orthopedics;  Laterality: Left;    History reviewed. No pertinent family history. Social History:  reports that she has never smoked. She has never used smokeless tobacco. She reports that she drinks about 3.6 ounces of alcohol per week. She reports that she does not use illicit drugs.  Allergies: No Known Allergies  Medications Prior to Admission  Medication Sig Dispense Refill  . Calcium-Vitamin D-Vitamin K (CALCIUM + D + K PO) Take 1 tablet by mouth daily.       . fish oil-omega-3 fatty acids 1000 MG capsule Take 1 g by mouth daily.       . Multiple Vitamin (MULITIVITAMIN WITH MINERALS) TABS Take 1 tablet by mouth daily.       . TURMERIC PO Take 1 tablet by mouth daily.       . ferrous sulfate 325 (65 FE) MG tablet Take 1 tablet (325 mg total) by mouth 3 (three) times daily after meals.      . Multiple Vitamins-Minerals (PRESERVISION AREDS PO) Take 1 tablet by mouth daily.         Results for orders placed during the hospital encounter of  08/12/12 (from the past 48 hour(s))  POCT I-STAT, CHEM 8     Status: None   Collection Time    08/12/12 11:18 AM      Result Value Range   Sodium 141  135 - 145 mEq/L   Potassium 4.0  3.5 - 5.1 mEq/L   Chloride 108  96 - 112 mEq/L   BUN 21  6 - 23 mg/dL   Creatinine, Ser 4.40  0.50 - 1.10 mg/dL   Glucose, Bld 95  70 - 99 mg/dL   Calcium, Ion 3.47  4.25 - 1.30 mmol/L   TCO2 26  0 - 100 mmol/L   Hemoglobin 14.6  12.0 - 15.0 g/dL   HCT 95.6  38.7 - 56.4 %   No results found.  ROS  No recent f/c/n/v/wt loss.  Blood pressure 141/85, pulse 86, temperature 96.7 F (35.9 C), temperature source Oral, resp. rate 20, height 5\' 4"  (1.626 m), weight 59.149 kg (130 lb 6.4 oz), SpO2 96.00%. Physical Exam  wn wd woman in nad.  A and O.  Mood and affect normal.  EOMI.  Resp unlabored.  R ankle with prominent swelling at the level of the extensor retinaculum.  4/5 strength in DF of the ankle.  Sens to LT intact at dorsum of foot.  2+ dp pulse.  Gastroc is contracted.  Assessment/Plan Right tibialis abnterior rupture and gastrocnemius contracture - to OR for repair v. Reconstruction of the tibialis anterior tendon and gastroc recession. The risks and benefits of the alternative treatment options have been discussed in detail.  The patient wishes to proceed with surgery and specifically understands risks of bleeding, infection, nerve damage, blood clots, need for additional surgery, amputation and death.   Toni Arthurs 06-Sep-2012, 11:23 AM

## 2012-08-12 NOTE — Brief Op Note (Signed)
08/12/2012  1:19 PM  PATIENT:  Kimberly Donovan  79 y.o. female  PRE-OPERATIVE DIAGNOSIS:  Right Tibialis Anterior Rupture and Gastroc Contracture  POST-OPERATIVE DIAGNOSIS:  Right Tibialis Anterior Rupture and Gastroc Contracture  Procedure(s): 1.  Right gastrocnemius recession 2.  Right tibialis anterior tendon reconstruction with plantaris tendon autograft  SURGEON:  Toni Arthurs, MD  ASSISTANT: n/a  ANESTHESIA:   General, regional  EBL:  minimal   TOURNIQUET:   Total Tourniquet Time Documented: Thigh (Right) - 53 minutes Total: Thigh (Right) - 53 minutes   COMPLICATIONS:  None apparent  DISPOSITION:  Extubated, awake and stable to recovery.  DICTATION ID:  [id number]

## 2012-08-12 NOTE — Anesthesia Preprocedure Evaluation (Signed)
Anesthesia Evaluation  Patient identified by MRN, date of birth, ID band Patient awake    Reviewed: Allergy & Precautions, H&P , NPO status , Patient's Chart, lab work & pertinent test results  History of Anesthesia Complications (+) PONV  Airway Mallampati: II TM Distance: >3 FB Neck ROM: Full    Dental no notable dental hx. (+) Teeth Intact and Dental Advisory Given   Pulmonary neg pulmonary ROS,  breath sounds clear to auscultation  Pulmonary exam normal       Cardiovascular negative cardio ROS  + dysrhythmias Rhythm:Regular Rate:Normal     Neuro/Psych negative neurological ROS  negative psych ROS   GI/Hepatic negative GI ROS, Neg liver ROS,   Endo/Other  negative endocrine ROS  Renal/GU negative Renal ROS  negative genitourinary   Musculoskeletal   Abdominal   Peds  Hematology negative hematology ROS (+)   Anesthesia Other Findings   Reproductive/Obstetrics negative OB ROS                           Anesthesia Physical Anesthesia Plan  ASA: II  Anesthesia Plan: General and Regional   Post-op Pain Management:    Induction: Intravenous  Airway Management Planned: LMA  Additional Equipment:   Intra-op Plan:   Post-operative Plan: Extubation in OR  Informed Consent: I have reviewed the patients History and Physical, chart, labs and discussed the procedure including the risks, benefits and alternatives for the proposed anesthesia with the patient or authorized representative who has indicated his/her understanding and acceptance.   Dental advisory given  Plan Discussed with: CRNA  Anesthesia Plan Comments:         Anesthesia Quick Evaluation

## 2012-08-13 ENCOUNTER — Encounter (HOSPITAL_BASED_OUTPATIENT_CLINIC_OR_DEPARTMENT_OTHER): Payer: Self-pay | Admitting: Orthopedic Surgery

## 2012-08-13 NOTE — Op Note (Signed)
NAMECARMINE, Kimberly Donovan              ACCOUNT NO.:  [id number]  MEDICAL RECORD NO.:  [id number]  LOCATION:                                 FACILITY:  PHYSICIAN:  Toni Arthurs, MD             DATE OF BIRTH:  DATE OF PROCEDURE:  08/12/2012 DATE OF DISCHARGE:                              OPERATIVE REPORT   PREOPERATIVE DIAGNOSES: 1. Right tibialis anterior tendon rupture. 2. Right gastrocnemius contracture.  POSTOPERATIVE DIAGNOSES: 1. Right tibialis anterior tendon rupture. 2. Right gastrocnemius contracture.  PROCEDURE: 1. Right gastrocnemius recession 2. Right tibialis anterior tendon reconstruction with plantaris tendon     autograft.  SURGEON:  Toni Arthurs, MD  ANESTHESIA:  General, regional.  ESTIMATED BLOOD LOSS:  Minimal.  TOURNIQUET TIME:  53 minutes at 220 mmHg.  COMPLICATIONS:  None apparent.  DISPOSITION:  Extubated, awake, and stable to recovery.  INDICATIONS FOR PROCEDURE:  The patient is a 79 year old woman, who sustained a rupture of her right tibialis anterior tendon approximately 3 weeks ago.  She presents now for operative treatment of this injury. She understands the risks and benefits, the alternative treatment options including closed management with eventual bracing and open surgical treatment with gastrocnemius recession and tibialis anterior tendon reconstruction.  She understands the risks and benefits, the alternative treatment options and would like to proceed with surgical treatment.  She specifically understands risks of bleeding, infection, nerve damage, blood clots, need for additional surgery, amputation, and death.  PROCEDURE IN DETAIL:  After preoperative consent was obtained and the correct operative site was identified, the patient was brought to the operating room and placed supine on the operating table.  General anesthesia was induced.  Preoperative antibiotics were administered. Surgical time-out was taken.  The right lower  extremity was prepped and draped in standard sterile fashion with a tourniquet around the thigh. The extremity was exsanguinated and the tourniquet was inflated to 220 mmHg.  A longitudinal incision was made at the medial aspect of the calf.  Sharp dissection was carried down through the skin and subcutaneous tissue.  The superficial fascia was incised.  The plantaris tendon was identified.  An open-ended tendon stripper was placed over the tendon and passed proximally releasing the tendon from its muscular origin that was then reversed and run distally, releasing it from its insertion.  Plantaris tendon was then placed on the back table, wrapped in a saline soaked sponge.  The gastrocnemius tendon was then identified and transected from medial to lateral in its entirety under direct vision.  The wound was irrigated copiously.  Inverted simple sutures of 3-0 Monocryl were used to close the subcutaneous tissue and a running 3- 0 nylon was used to close the skin incision.  Attention was then turned to the anterior aspect of the ankle where a longitudinal incision was made over the tibialis anterior tendon.  Sharp dissection was carried down through the skin and subcutaneous tissue. The tibialis anterior tendon sheath was incised over the prominent area of swelling.  The retinaculum was left intact.  The tendon was noted to be ruptured with a large area of swelling and hematoma in the central portion  of the tendon but distally the tendon was intact at its insertion on the medial cuneiform.  The ruptured area was separated and the ragged ends of the tendon were trimmed back to a healthy tendon level.  The 2 tendon ends would overlap sufficiently for direct repair. The decision made was made to augment the repair with the plantaris tendon graft.  After the tendon ends were freshened, horizontal mattress sutures were used to repair the tendon in side-by-side fashion.  A hemostat was then used  to pass the plantaris tendon through the proximal end of the tendon.  It was sutured in place with 3-0 Monocryl.  It was then placed across the tendon repair and passed through the distal stump of the tendon.  This was repeated several times with the plantaris tendon ultimately crossing the repair site approximately 5 times.  The tendon was sewn in at each pass through the tibialis anterior tendon with 3-0 Monocryl simple sutures.  The wound was irrigated copiously. The tibialis anterior tendon sheath was then repaired over the tendon repair with inverted simple sutures of 3-0 Monocryl.  Subcutaneous tissue was approximated with inverted simple sutures of 3-0 Monocryl and a running 3-0 nylon was used to close the skin incision.  Sterile dressings were applied followed by a well-padded short-leg splint with the ankle allowed to passively plantar flex.  There was no gapping at any of the tendon repair sites.  Tourniquet was released at 53 minutes after application of the dressings.  A well-padded short-leg cast was placed, holding the ankle in slight dorsiflexion.  The patient was then awakened from anesthesia and transported to recovery room in stable condition.  FOLLOWUP PLAN:  The patient will be nonweightbearing on the right lower extremity.  She will follow up with me in 2 weeks for cast removal and suture removal as well as beginning weightbearing in a cast.     Toni Arthurs, MD     JH/MEDQ  D:  08/12/2012  T:  08/13/2012  Job:  [id number]

## 2012-08-23 DIAGNOSIS — Z4789 Encounter for other orthopedic aftercare: Secondary | ICD-10-CM | POA: Diagnosis not present

## 2012-09-30 DIAGNOSIS — S93499A Sprain of other ligament of unspecified ankle, initial encounter: Secondary | ICD-10-CM | POA: Diagnosis not present

## 2012-09-30 DIAGNOSIS — S96819A Strain of other specified muscles and tendons at ankle and foot level, unspecified foot, initial encounter: Secondary | ICD-10-CM | POA: Diagnosis not present

## 2012-10-08 DIAGNOSIS — S93499A Sprain of other ligament of unspecified ankle, initial encounter: Secondary | ICD-10-CM | POA: Diagnosis not present

## 2012-10-21 DIAGNOSIS — S96819A Strain of other specified muscles and tendons at ankle and foot level, unspecified foot, initial encounter: Secondary | ICD-10-CM | POA: Diagnosis not present

## 2012-10-29 DIAGNOSIS — M25579 Pain in unspecified ankle and joints of unspecified foot: Secondary | ICD-10-CM | POA: Diagnosis not present

## 2012-11-05 DIAGNOSIS — M25579 Pain in unspecified ankle and joints of unspecified foot: Secondary | ICD-10-CM | POA: Diagnosis not present

## 2012-11-26 DIAGNOSIS — M25579 Pain in unspecified ankle and joints of unspecified foot: Secondary | ICD-10-CM | POA: Diagnosis not present

## 2012-11-29 DIAGNOSIS — M25579 Pain in unspecified ankle and joints of unspecified foot: Secondary | ICD-10-CM | POA: Diagnosis not present

## 2012-12-27 DIAGNOSIS — G5760 Lesion of plantar nerve, unspecified lower limb: Secondary | ICD-10-CM | POA: Diagnosis not present

## 2013-02-07 DIAGNOSIS — M2040 Other hammer toe(s) (acquired), unspecified foot: Secondary | ICD-10-CM | POA: Diagnosis not present

## 2013-03-22 DIAGNOSIS — Z23 Encounter for immunization: Secondary | ICD-10-CM | POA: Diagnosis not present

## 2013-05-20 DIAGNOSIS — H3530 Unspecified macular degeneration: Secondary | ICD-10-CM | POA: Diagnosis not present

## 2013-05-20 DIAGNOSIS — H5200 Hypermetropia, unspecified eye: Secondary | ICD-10-CM | POA: Diagnosis not present

## 2013-05-20 DIAGNOSIS — H52209 Unspecified astigmatism, unspecified eye: Secondary | ICD-10-CM | POA: Diagnosis not present

## 2013-05-20 DIAGNOSIS — Z961 Presence of intraocular lens: Secondary | ICD-10-CM | POA: Diagnosis not present

## 2013-10-28 DIAGNOSIS — R002 Palpitations: Secondary | ICD-10-CM | POA: Diagnosis not present

## 2013-10-28 DIAGNOSIS — M949 Disorder of cartilage, unspecified: Secondary | ICD-10-CM | POA: Diagnosis not present

## 2013-10-28 DIAGNOSIS — E7800 Pure hypercholesterolemia, unspecified: Secondary | ICD-10-CM | POA: Diagnosis not present

## 2013-10-28 DIAGNOSIS — IMO0002 Reserved for concepts with insufficient information to code with codable children: Secondary | ICD-10-CM | POA: Diagnosis not present

## 2013-10-28 DIAGNOSIS — Z23 Encounter for immunization: Secondary | ICD-10-CM | POA: Diagnosis not present

## 2013-10-28 DIAGNOSIS — Z0000 Encounter for general adult medical examination without abnormal findings: Secondary | ICD-10-CM | POA: Diagnosis not present

## 2013-10-28 DIAGNOSIS — M899 Disorder of bone, unspecified: Secondary | ICD-10-CM | POA: Diagnosis not present

## 2013-10-28 DIAGNOSIS — M1710 Unilateral primary osteoarthritis, unspecified knee: Secondary | ICD-10-CM | POA: Diagnosis not present

## 2013-10-28 DIAGNOSIS — Z1331 Encounter for screening for depression: Secondary | ICD-10-CM | POA: Diagnosis not present

## 2013-11-10 DIAGNOSIS — M25559 Pain in unspecified hip: Secondary | ICD-10-CM | POA: Diagnosis not present

## 2013-11-10 DIAGNOSIS — M76899 Other specified enthesopathies of unspecified lower limb, excluding foot: Secondary | ICD-10-CM | POA: Diagnosis not present

## 2013-11-10 DIAGNOSIS — M25569 Pain in unspecified knee: Secondary | ICD-10-CM | POA: Diagnosis not present

## 2013-11-16 DIAGNOSIS — M949 Disorder of cartilage, unspecified: Secondary | ICD-10-CM | POA: Diagnosis not present

## 2013-11-16 DIAGNOSIS — M899 Disorder of bone, unspecified: Secondary | ICD-10-CM | POA: Diagnosis not present

## 2013-11-17 ENCOUNTER — Encounter: Payer: Self-pay | Admitting: Cardiology

## 2013-11-17 DIAGNOSIS — H9190 Unspecified hearing loss, unspecified ear: Secondary | ICD-10-CM | POA: Insufficient documentation

## 2013-11-17 DIAGNOSIS — Z8719 Personal history of other diseases of the digestive system: Secondary | ICD-10-CM | POA: Insufficient documentation

## 2013-11-17 DIAGNOSIS — Z9889 Other specified postprocedural states: Secondary | ICD-10-CM

## 2013-11-17 DIAGNOSIS — M1990 Unspecified osteoarthritis, unspecified site: Secondary | ICD-10-CM | POA: Insufficient documentation

## 2013-11-17 DIAGNOSIS — E785 Hyperlipidemia, unspecified: Secondary | ICD-10-CM | POA: Insufficient documentation

## 2013-11-17 DIAGNOSIS — Z974 Presence of external hearing-aid: Secondary | ICD-10-CM | POA: Insufficient documentation

## 2013-11-17 DIAGNOSIS — R112 Nausea with vomiting, unspecified: Secondary | ICD-10-CM | POA: Insufficient documentation

## 2013-11-17 DIAGNOSIS — I499 Cardiac arrhythmia, unspecified: Secondary | ICD-10-CM | POA: Insufficient documentation

## 2014-01-02 DIAGNOSIS — H903 Sensorineural hearing loss, bilateral: Secondary | ICD-10-CM | POA: Diagnosis not present

## 2014-01-05 DIAGNOSIS — M76899 Other specified enthesopathies of unspecified lower limb, excluding foot: Secondary | ICD-10-CM | POA: Diagnosis not present

## 2014-01-05 DIAGNOSIS — M25559 Pain in unspecified hip: Secondary | ICD-10-CM | POA: Diagnosis not present

## 2014-01-13 DIAGNOSIS — M76899 Other specified enthesopathies of unspecified lower limb, excluding foot: Secondary | ICD-10-CM | POA: Diagnosis not present

## 2014-01-19 ENCOUNTER — Encounter: Payer: Self-pay | Admitting: Cardiology

## 2014-01-19 ENCOUNTER — Ambulatory Visit (INDEPENDENT_AMBULATORY_CARE_PROVIDER_SITE_OTHER): Payer: Medicare Other | Admitting: Cardiology

## 2014-01-19 VITALS — BP 130/90 | HR 75 | Ht 63.0 in | Wt 128.0 lb

## 2014-01-19 DIAGNOSIS — I351 Nonrheumatic aortic (valve) insufficiency: Secondary | ICD-10-CM | POA: Insufficient documentation

## 2014-01-19 DIAGNOSIS — I059 Rheumatic mitral valve disease, unspecified: Secondary | ICD-10-CM

## 2014-01-19 DIAGNOSIS — R030 Elevated blood-pressure reading, without diagnosis of hypertension: Secondary | ICD-10-CM | POA: Diagnosis not present

## 2014-01-19 DIAGNOSIS — IMO0001 Reserved for inherently not codable concepts without codable children: Secondary | ICD-10-CM

## 2014-01-19 DIAGNOSIS — R002 Palpitations: Secondary | ICD-10-CM

## 2014-01-19 DIAGNOSIS — I340 Nonrheumatic mitral (valve) insufficiency: Secondary | ICD-10-CM | POA: Insufficient documentation

## 2014-01-19 DIAGNOSIS — I359 Nonrheumatic aortic valve disorder, unspecified: Secondary | ICD-10-CM

## 2014-01-19 MED ORDER — METOPROLOL SUCCINATE ER 25 MG PO TB24: 25.0000 mg | ORAL_TABLET | Freq: Every day | ORAL | Status: DC

## 2014-01-19 NOTE — Patient Instructions (Signed)
Please start Metoprolol succinate 25 mg once a day. Continue all other medications as listed.  Follow up in 2 months with Dr Anne Fu.

## 2014-01-19 NOTE — Progress Notes (Signed)
[address] [place], [place]  [address] Phone: [phone] Fax:  [phone]  Date:  01/19/2014   ID:  Kimberly Donovan, DOB March 14, 1919, MRN [id number]  PCP:  Georgann Housekeeper, MD   History of Present Illness: Kimberly Donovan is a 80 y.o. female palpitations, dizziness, possible SVT perhaps atrial fibrillation. She's had these sensations for years. They usually last a few minutes duration sometimes less. Sometimes feels dizzy. Her daughter had SVT ablation after seeing me, Dr. Ladona Ridgel. She has not been on any medications for this. Usually she lays down and her symptoms past. She's not had any strokes.  I previously saw her prior to tendon repair.    Wt Readings from Last 3 Encounters:  01/19/14 128 lb (58.06 kg)  08/12/12 130 lb 6.4 oz (59.149 kg)  08/12/12 130 lb 6.4 oz (59.149 kg)     Past Medical History  Diagnosis Date  . PONV (postoperative nausea and vomiting)   . Arthritis   . Dysrhythmia     states has palpitations-w/u/ neg 2003 per note Dr Donette Larry  . Dyslipidemia   . Dysphagia   . HOH (hard of hearing)   . Wears hearing aid     both ears  . H/O hiatal hernia     no meds    Past Surgical History  Procedure Laterality Date  . Joint replacement      right total knee  2009  . Tympanoplasty      right with skin grafting   . Carpal tunnel release      bilateral  . Abdominal hysterectomy      with BSO  . Rectocele repair    . Eye surgery      repair of lid left/BILATERAL CATARACT EXTRACTION WITH IOL  . Total knee arthroplasty  05/27/2011    Procedure: TOTAL KNEE ARTHROPLASTY;  Surgeon: Shelda Pal, MD;  Location: [facility];  Service: Orthopedics;  Laterality: Left;  . Tendon repair Right 08/12/2012    Procedure: RIGHT TIB AND REPAIR VS RECONSTRUCTION POSSIBLE EHL TRANSFER AND GASTROC RECESSION;  Surgeon: Toni Arthurs, MD;  Location: [facility];  Service: Orthopedics;  Laterality: Right;    Current Outpatient Prescriptions    Medication Sig Dispense Refill  . Calcium-Vitamin D-Vitamin K (CALCIUM + D + K PO) Take 1 tablet by mouth daily.       . fish oil-omega-3 fatty acids 1000 MG capsule Take 1 g by mouth daily.       . Multiple Vitamin (MULITIVITAMIN WITH MINERALS) TABS Take 1 tablet by mouth daily.       . Multiple Vitamins-Minerals (PRESERVISION AREDS PO) Take 1 tablet by mouth daily.        No current facility-administered medications for this visit.    Allergies:   No Known Allergies  Social History:  The patient  reports that she has never smoked. She has never used smokeless tobacco. She reports that she drinks about 3.6 ounces of alcohol per week. She reports that she does not use illicit drugs.   No family history on file.  ROS:  Please see the history of present illness.   Denies any fevers, chills, orthopnea, PND, chest pain, syncope. Positive palpitations.   All other systems reviewed and negative.   PHYSICAL EXAM: VS:  BP 130/90  Pulse 75  Ht  (1.6 m)  Wt 128 lb (58.06 kg)  BMI 22.68 kg/m2 Well nourished, well developed, in no acute  distress HEENT: normal, [place]/AT, EOMI Neck: no JVD, normal carotid upstroke, no bruit Cardiac:  normal S1, S2; RRR; no murmur Lungs:  clear to auscultation bilaterally, no wheezing, rhonchi or rales Abd: soft, nontender, no hepatomegaly, no bruits Ext: no edema, 2+ distal pulses Skin: warm and dry GU: deferred Neuro: no focal abnormalities noted, AAO x 3  EKG:  01/19/14-sinus rhythm, 75, left anterior fascicular block    Echocardiogram: 08/02/12-normal ejection fraction, mild AI/TR/MR  ASSESSMENT AND PLAN:  1. Palpitations/PSVT-I will start low-dose metoprolol 25 mg once a day extended release. If this works, excellent. If not, we will obtain a monitor to diagnose rhythm. I think this is a reasonable strategy for her given her advanced age. She did not seem terribly enthusiastic about wearing a monitor. 2. Mild mitral regurgitation-stable, should be of  no clinical consequence. 3. Mild aortic regurgitation-stable, should be of no clinical consequence. 4. Elevated blood pressure-diastolic 90. Willing to tolerate given her advanced age. 5. 2 month follow up.  Signed, Donato Schultz, MD [facility]  01/19/2014 10:17 AM

## 2014-01-20 DIAGNOSIS — M76899 Other specified enthesopathies of unspecified lower limb, excluding foot: Secondary | ICD-10-CM | POA: Diagnosis not present

## 2014-01-27 DIAGNOSIS — M76899 Other specified enthesopathies of unspecified lower limb, excluding foot: Secondary | ICD-10-CM | POA: Diagnosis not present

## 2014-02-02 ENCOUNTER — Encounter: Payer: Self-pay | Admitting: Cardiology

## 2014-02-03 DIAGNOSIS — M76899 Other specified enthesopathies of unspecified lower limb, excluding foot: Secondary | ICD-10-CM | POA: Diagnosis not present

## 2014-02-06 ENCOUNTER — Ambulatory Visit: Payer: Medicare Other | Admitting: Cardiology

## 2014-02-10 DIAGNOSIS — M7072 Other bursitis of hip, left hip: Secondary | ICD-10-CM | POA: Diagnosis not present

## 2014-02-16 DIAGNOSIS — M7072 Other bursitis of hip, left hip: Secondary | ICD-10-CM | POA: Diagnosis not present

## 2014-02-17 DIAGNOSIS — M25552 Pain in left hip: Secondary | ICD-10-CM | POA: Diagnosis not present

## 2014-02-17 DIAGNOSIS — S8002XD Contusion of left knee, subsequent encounter: Secondary | ICD-10-CM | POA: Diagnosis not present

## 2014-02-17 DIAGNOSIS — Z23 Encounter for immunization: Secondary | ICD-10-CM | POA: Diagnosis not present

## 2014-02-17 DIAGNOSIS — M25562 Pain in left knee: Secondary | ICD-10-CM | POA: Diagnosis not present

## 2014-02-17 DIAGNOSIS — S7002XD Contusion of left hip, subsequent encounter: Secondary | ICD-10-CM | POA: Diagnosis not present

## 2014-03-28 ENCOUNTER — Ambulatory Visit (INDEPENDENT_AMBULATORY_CARE_PROVIDER_SITE_OTHER): Payer: Medicare Other | Admitting: Cardiology

## 2014-03-28 ENCOUNTER — Encounter: Payer: Self-pay | Admitting: Cardiology

## 2014-03-28 VITALS — BP 142/88 | HR 76 | Ht 63.0 in | Wt 134.0 lb

## 2014-03-28 DIAGNOSIS — I340 Nonrheumatic mitral (valve) insufficiency: Secondary | ICD-10-CM

## 2014-03-28 DIAGNOSIS — R002 Palpitations: Secondary | ICD-10-CM | POA: Diagnosis not present

## 2014-03-28 NOTE — Patient Instructions (Signed)
The current medical regimen is effective;  continue present plan and medications.  Follow up in 6 months with Dr. Skains.  You will receive a letter in the mail 2 months before you are due.  Please call us when you receive this letter to schedule your follow up appointment.  Thank you for choosing [facility]!!     

## 2014-03-28 NOTE — Progress Notes (Signed)
[address] [place], [place]  [address] Phone: [phone](336) [phone] Fax:  [phone]([phone]  Date:  03/28/2014   ID:  Kimberly StarrShirley O Centola, DOB 07/12/1918, MRN [id number]  PCP:  Georgann HousekeeperHUSAIN,KARRAR, MD   History of Present Illness: Kimberly Donovan is a 80 y.o. female Previously seen for evaluation of palpitations, dizziness, possible SVT perhaps atrial fibrillation. She's had these sensations for years. They usually last a few minutes duration sometimes less. Sometimes feels dizzy. Her daughter Randall HissHolly Grant had SVT ablation after seeing me, Dr. Ladona Ridgelaylor. She has not been on any medications for this. Usually she lays down and her symptoms past. She's not had any strokes.  I previously saw her prior to tendon repair. Lives alone.   Toprol has been started and has helped her out significantly. She is not feeling any further palpitations. Excellent. No chest pain, no syncope.    Wt Readings from Last 3 Encounters:  03/28/14 134 lb (60.782 kg)  01/19/14 128 lb (58.06 kg)  08/12/12 130 lb 6.4 oz (59.149 kg)     Past Medical History  Diagnosis Date  . PONV (postoperative nausea and vomiting)   . Arthritis   . Dysrhythmia     states has palpitations-w/u/ neg 2003 per note Dr Donette LarryHusain  . Dyslipidemia   . Dysphagia   . HOH (hard of hearing)   . Wears hearing aid     both ears  . H/O hiatal hernia     no meds    Past Surgical History  Procedure Laterality Date  . Joint replacement      right total knee  2009  . Tympanoplasty      right with skin grafting   . Carpal tunnel release      bilateral  . Abdominal hysterectomy      with BSO  . Rectocele repair    . Eye surgery      repair of lid left/BILATERAL CATARACT EXTRACTION WITH IOL  . Total knee arthroplasty  05/27/2011    Procedure: TOTAL KNEE ARTHROPLASTY;  Surgeon: Shelda PalMatthew D Olin, MD;  Location: [facility];  Service: Orthopedics;  Laterality: Left;  . Tendon repair Right 08/12/2012    Procedure: RIGHT TIB AND REPAIR VS  RECONSTRUCTION POSSIBLE EHL TRANSFER AND GASTROC RECESSION;  Surgeon: Toni ArthursJohn Hewitt, MD;  Location: [facility];  Service: Orthopedics;  Laterality: Right;    Current Outpatient Prescriptions  Medication Sig Dispense Refill  . aspirin 81 MG tablet Take 81 mg by mouth daily.    . Calcium-Vitamin D-Vitamin K (CALCIUM + D + K PO) Take 1 tablet by mouth daily.     . metoprolol succinate (TOPROL-XL) 25 MG 24 hr tablet Take 1 tablet (25 mg total) by mouth daily. Take with or immediately following a meal. 30 tablet 11  . Multiple Vitamin (MULITIVITAMIN WITH MINERALS) TABS Take 1 tablet by mouth daily.     . Multiple Vitamins-Minerals (PRESERVISION AREDS PO) Take 1 tablet by mouth daily.      No current facility-administered medications for this visit.    Allergies:   No Known Allergies  Social History:  The patient  reports that she has never smoked. She has never used smokeless tobacco. She reports that she drinks about 3.6 oz of alcohol per week. She reports that she does not use illicit drugs.   Family History  Problem Relation Age of Onset  . Anemia Neg Hx   . Arrhythmia Neg Hx   .  Asthma Neg Hx   . Clotting disorder Neg Hx   . Fainting Neg Hx   . Heart disease Neg Hx   . Heart attack Neg Hx   . Heart failure Neg Hx   . Hyperlipidemia Neg Hx   . Hypertension Neg Hx     ROS:  Please see the history of present illness.   Denies any fevers, chills, orthopnea, PND, chest pain, syncope. Positive palpitations.   All other systems reviewed and negative.   PHYSICAL EXAM: VS:  BP 142/88 mmHg  Pulse 76  Ht 5\' 3"  (1.6 m)  Wt 134 lb (60.782 kg)  BMI 23.74 kg/m2 Well nourished, well developed, in no acute distresslooks younger than stated age HEENT: normal, [place]/AT, EOMI Neck: no JVD, normal carotid upstroke, no bruit Cardiac:  normal S1, S2; RRR; no murmur Lungs:  clear to auscultation bilaterally, no wheezing, rhonchi or rales Abd: soft, nontender, no hepatomegaly, no  bruits Ext: no edema, 2+ distal pulses Skin: warm and dry GU: deferred Neuro: no focal abnormalities noted, AAO x 3  EKG:  01/19/14-sinus rhythm, 75, left anterior fascicular block    Echocardiogram: 08/02/12-normal ejection fraction, mild AI/TR/MR  ASSESSMENT AND PLAN:  1. Palpitations/PSVT- doing very well on low-dose metoprolol. No need for further evaluation at this time.  I think this is a reasonable strategy for her given her advanced age. She did not seem terribly enthusiastic about wearing a monitor. 2. Mild mitral regurgitation-stable, should be of no clinical consequence. 3. Mild aortic regurgitation-stable, should be of no clinical consequence. 4. Elevated blood pressure-previously diastolic 90. Mildly improved. Willing to tolerate given her advanced age. 5. 6 month follow up.  Signed, Donato SchultzMark Sanam Marmo, MD [facility]  03/28/2014 11:07 AM

## 2014-05-25 ENCOUNTER — Other Ambulatory Visit: Payer: Self-pay | Admitting: Internal Medicine

## 2014-05-25 DIAGNOSIS — R4702 Dysphasia: Secondary | ICD-10-CM

## 2014-05-25 DIAGNOSIS — K222 Esophageal obstruction: Secondary | ICD-10-CM | POA: Diagnosis not present

## 2014-05-25 DIAGNOSIS — R1310 Dysphagia, unspecified: Secondary | ICD-10-CM | POA: Diagnosis not present

## 2014-05-26 DIAGNOSIS — Z961 Presence of intraocular lens: Secondary | ICD-10-CM | POA: Diagnosis not present

## 2014-05-26 DIAGNOSIS — H52203 Unspecified astigmatism, bilateral: Secondary | ICD-10-CM | POA: Diagnosis not present

## 2014-05-26 DIAGNOSIS — H33301 Unspecified retinal break, right eye: Secondary | ICD-10-CM | POA: Diagnosis not present

## 2014-05-26 DIAGNOSIS — H3531 Nonexudative age-related macular degeneration: Secondary | ICD-10-CM | POA: Diagnosis not present

## 2014-05-29 ENCOUNTER — Ambulatory Visit
Admission: RE | Admit: 2014-05-29 | Discharge: 2014-05-29 | Disposition: A | Discharge status: Home / Self Care | Payer: Medicare Other | Source: Ambulatory Visit | Attending: Internal Medicine | Admitting: Internal Medicine

## 2014-05-29 DIAGNOSIS — R1310 Dysphagia, unspecified: Secondary | ICD-10-CM | POA: Diagnosis not present

## 2014-05-29 DIAGNOSIS — K228 Other specified diseases of esophagus: Secondary | ICD-10-CM | POA: Diagnosis not present

## 2014-05-29 DIAGNOSIS — R4702 Dysphasia: Secondary | ICD-10-CM

## 2014-08-19 DIAGNOSIS — R1010 Upper abdominal pain, unspecified: Secondary | ICD-10-CM | POA: Diagnosis not present

## 2014-08-19 DIAGNOSIS — R21 Rash and other nonspecific skin eruption: Secondary | ICD-10-CM | POA: Diagnosis not present

## 2014-09-25 ENCOUNTER — Ambulatory Visit (INDEPENDENT_AMBULATORY_CARE_PROVIDER_SITE_OTHER): Payer: Medicare Other | Admitting: Cardiology

## 2014-09-25 ENCOUNTER — Encounter: Payer: Self-pay | Admitting: Cardiology

## 2014-09-25 VITALS — BP 140/82 | HR 69 | Ht 63.0 in | Wt 134.8 lb

## 2014-09-25 DIAGNOSIS — I471 Supraventricular tachycardia: Secondary | ICD-10-CM

## 2014-09-25 MED ORDER — METOPROLOL SUCCINATE ER 25 MG PO TB24: 25.0000 mg | ORAL_TABLET | Freq: Every day | ORAL | Status: DC

## 2014-09-25 NOTE — Progress Notes (Signed)
[address] [place], [place]  [address] Phone: [phone]([phone] Fax:  [phone]  Date:  09/25/2014   ID:  Kimberly StarrShirley O Main[place], DOB 07/28/1918, MRN [id number]  PCP:  Georgann HousekeeperHUSAIN,KARRAR, MD   History of Present Illness: Kimberly Donovan is a 81 y.o. female previously seen for evaluation of palpitations, dizziness, possible SVT perhaps atrial fibrillation. She's had these sensations for years. They usually last a few minutes duration sometimes less. Sometimes feels dizzy. Her daughter Randall HissHolly Grant had SVT ablation after seeing me, Dr. Ladona Ridgelaylor. She has not been on any medications for this. Usually she lays down and her symptoms past. She's not had any strokes.  I previously saw her prior to tendon repair. Lives alone.   Toprol has been started and has helped her out significantly. She is not feeling any further palpitations. Excellent. No chest pain, no syncope.  Overall she is very pleased with the progress since being on Toprol.    Wt Readings from Last 3 Encounters:  09/25/14 134 lb 12.8 oz (61.145 kg)  03/28/14 134 lb (60.782 kg)  01/19/14 128 lb (58.06 kg)     Past Medical History  Diagnosis Date  . PONV (postoperative nausea and vomiting)   . Arthritis   . Dysrhythmia     states has palpitations-w/u/ neg 2003 per note Dr Donette LarryHusain  . Dyslipidemia   . Dysphagia   . HOH (hard of hearing)   . Wears hearing aid     both ears  . H/O hiatal hernia     no meds    Past Surgical History  Procedure Laterality Date  . Joint replacement      right total knee  2009  . Tympanoplasty      right with skin grafting   . Carpal tunnel release      bilateral  . Abdominal hysterectomy      with BSO  . Rectocele repair    . Eye surgery      repair of lid left/BILATERAL CATARACT EXTRACTION WITH IOL  . Total knee arthroplasty  05/27/2011    Procedure: TOTAL KNEE ARTHROPLASTY;  Surgeon: Shelda PalMatthew D Olin, MD;  Location: [facility];  Service: Orthopedics;  Laterality: Left;  . Tendon  repair Right 08/12/2012    Procedure: RIGHT TIB AND REPAIR VS RECONSTRUCTION POSSIBLE EHL TRANSFER AND GASTROC RECESSION;  Surgeon: Toni ArthursJohn Hewitt, MD;  Location: [facility];  Service: Orthopedics;  Laterality: Right;    Current Outpatient Prescriptions  Medication Sig Dispense Refill  . aspirin 81 MG tablet Take 81 mg by mouth daily.    . Calcium-Vitamin D-Vitamin K (CALCIUM + D + K PO) Take 1 tablet by mouth daily.     . metoprolol succinate (TOPROL-XL) 25 MG 24 hr tablet Take 1 tablet (25 mg total) by mouth daily. Take with or immediately following a meal. 30 tablet 11  . Multiple Vitamin (MULITIVITAMIN WITH MINERALS) TABS Take 1 tablet by mouth daily.      No current facility-administered medications for this visit.    Allergies:   No Known Allergies  Social History:  The patient  reports that she has never smoked. She has never used smokeless tobacco. She reports that she drinks about 3.6 oz of alcohol per week. She reports that she does not use illicit drugs.   Family History  Problem Relation Age of Onset  . Anemia Neg Hx   . Arrhythmia Neg Hx   . Asthma Neg Hx   .  Clotting disorder Neg Hx   . Fainting Neg Hx   . Heart attack Neg Hx   . Heart failure Neg Hx   . Hyperlipidemia Neg Hx   . Hypertension Neg Hx   . Osteoporosis Mother   . Heart disease Mother   . Lung cancer Father   . Lung cancer Sister   . Colon cancer Brother   . Atrial fibrillation Sister   . Atrial fibrillation Sister   . Atrial fibrillation Sister   . Lung disease Brother   . Cancer Brother     ROS:  Please see the history of present illness.   Denies any fevers, chills, orthopnea, PND, chest pain, syncope. Positive palpitations.   All other systems reviewed and negative.   PHYSICAL EXAM: VS:  BP 140/82 mmHg  Pulse 69  Ht 5\' 3"  (1.6 m)  Wt 134 lb 12.8 oz (61.145 kg)  BMI 23.88 kg/m2  SpO2 94% Well nourished, well developed, in no acute distresslooks younger than stated age HEENT:  normal, [place]/AT, EOMI Neck: no JVD, normal carotid upstroke, no bruit Cardiac:  normal S1, S2; RRR; no murmur Lungs:  clear to auscultation bilaterally, no wheezing, rhonchi or rales Abd: soft, nontender, no hepatomegaly, no bruits Ext: no edema, 2+ distal pulses Skin: warm and dry GU: deferred Neuro: no focal abnormalities noted, AAO x 3  EKG:  01/19/14-sinus rhythm, 75, left anterior fascicular block    Echocardiogram: 08/02/12-normal ejection fraction, mild AI/TR/MR  ASSESSMENT AND PLAN:  1. Palpitations/PSVT- doing very well on low-dose metoprolol. We will give 3 month prescription. No need for further evaluation at this time.  I think this is a reasonable strategy for her given her advanced age. She did not seem terribly enthusiastic about wearing a monitor. 2. Mild mitral regurgitation-stable, should be of no clinical consequence. 3. Mild aortic regurgitation-stable, should be of no clinical consequence. 4. Six-month follow-up  Signed, Donato SchultzMark Skains, MD [facility]  09/25/2014 2:04 PM

## 2014-09-25 NOTE — Patient Instructions (Signed)
Medication Instructions:  The current medical regimen is effective;  continue present plan and medications.  Follow-Up: Follow up in 6 months with Dr. Skains.  You will receive a letter in the mail 2 months before you are due.  Please call us when you receive this letter to schedule your follow up appointment.  Thank you for choosing [facility]!!     

## 2014-10-26 DIAGNOSIS — M19041 Primary osteoarthritis, right hand: Secondary | ICD-10-CM | POA: Diagnosis not present

## 2014-10-26 DIAGNOSIS — M79642 Pain in left hand: Secondary | ICD-10-CM | POA: Diagnosis not present

## 2014-10-26 DIAGNOSIS — M79645 Pain in left finger(s): Secondary | ICD-10-CM | POA: Diagnosis not present

## 2015-01-04 DIAGNOSIS — H6122 Impacted cerumen, left ear: Secondary | ICD-10-CM | POA: Diagnosis not present

## 2015-01-04 DIAGNOSIS — H903 Sensorineural hearing loss, bilateral: Secondary | ICD-10-CM | POA: Diagnosis not present

## 2015-02-21 DIAGNOSIS — H903 Sensorineural hearing loss, bilateral: Secondary | ICD-10-CM | POA: Diagnosis not present

## 2015-02-21 DIAGNOSIS — H6061 Unspecified chronic otitis externa, right ear: Secondary | ICD-10-CM | POA: Diagnosis not present

## 2015-02-21 DIAGNOSIS — J342 Deviated nasal septum: Secondary | ICD-10-CM | POA: Diagnosis not present

## 2015-02-21 DIAGNOSIS — H6122 Impacted cerumen, left ear: Secondary | ICD-10-CM | POA: Diagnosis not present

## 2015-02-27 DIAGNOSIS — Z23 Encounter for immunization: Secondary | ICD-10-CM | POA: Diagnosis not present

## 2015-03-23 DIAGNOSIS — K224 Dyskinesia of esophagus: Secondary | ICD-10-CM | POA: Diagnosis not present

## 2015-05-28 DIAGNOSIS — H353132 Nonexudative age-related macular degeneration, bilateral, intermediate dry stage: Secondary | ICD-10-CM | POA: Diagnosis not present

## 2015-05-28 DIAGNOSIS — Z961 Presence of intraocular lens: Secondary | ICD-10-CM | POA: Diagnosis not present

## 2015-05-28 DIAGNOSIS — H52203 Unspecified astigmatism, bilateral: Secondary | ICD-10-CM | POA: Diagnosis not present

## 2015-07-04 DIAGNOSIS — Z1389 Encounter for screening for other disorder: Secondary | ICD-10-CM | POA: Diagnosis not present

## 2015-07-04 DIAGNOSIS — M899 Disorder of bone, unspecified: Secondary | ICD-10-CM | POA: Diagnosis not present

## 2015-07-04 DIAGNOSIS — Z0001 Encounter for general adult medical examination with abnormal findings: Secondary | ICD-10-CM | POA: Diagnosis not present

## 2015-07-04 DIAGNOSIS — H9193 Unspecified hearing loss, bilateral: Secondary | ICD-10-CM | POA: Diagnosis not present

## 2015-07-04 DIAGNOSIS — M179 Osteoarthritis of knee, unspecified: Secondary | ICD-10-CM | POA: Diagnosis not present

## 2015-07-04 DIAGNOSIS — E7800 Pure hypercholesterolemia, unspecified: Secondary | ICD-10-CM | POA: Diagnosis not present

## 2015-07-04 DIAGNOSIS — K224 Dyskinesia of esophagus: Secondary | ICD-10-CM | POA: Diagnosis not present

## 2015-07-04 DIAGNOSIS — R002 Palpitations: Secondary | ICD-10-CM | POA: Diagnosis not present

## 2015-07-14 DIAGNOSIS — J069 Acute upper respiratory infection, unspecified: Secondary | ICD-10-CM | POA: Diagnosis not present

## 2015-10-02 ENCOUNTER — Other Ambulatory Visit: Payer: Self-pay | Admitting: Cardiology

## 2015-10-17 NOTE — Progress Notes (Signed)
Cardiology Office Note    Date:  10/18/2015   ID:  Kimberly Donovan, DOB 1918-11-19, MRN [id number]  PCP:  Pearla Dubonnet, MD  Cardiologist:  Dr. Anne Fu  Med refills   History of Present Illness:  Kimberly Donovan is a 82 y.o. female with a history of HLD, HTN, palpitations/PSVT, mild MR/AR who presents to clinic for medication refills.   She has been followed by Dr. Anne Fu for heart palpitations and known to have PSVT. This has been under excellent control on low dose Toprol XL 25mg  daily. Dr. Anne Fu saw her last in 09/2014. He felt like a monitor was unnecessary given her advanced age.   Today she presents to clinic for follow up. She is doing quite well except for a cough that is worse at night when laying flat. This is sometimes productive of brown to reddish sputum. She's had no fevers or chills or weight loss. It does not cause her pain but it does bother her and keep her up. No chest pain or shortness of breath, no lower extremity edema orthopnea or PND. No dizziness or syncope.  Past Medical History  Diagnosis Date  . PONV (postoperative nausea and vomiting)   . Arthritis   . Dysrhythmia     states has palpitations-w/u/ neg 2003 per note Dr Donette Larry  . Dyslipidemia   . Dysphagia   . HOH (hard of hearing)   . Wears hearing aid     both ears  . H/O hiatal hernia     no meds    Past Surgical History  Procedure Laterality Date  . Joint replacement      right total knee  2009  . Tympanoplasty      right with skin grafting   . Carpal tunnel release      bilateral  . Abdominal hysterectomy      with BSO  . Rectocele repair    . Eye surgery      repair of lid left/BILATERAL CATARACT EXTRACTION WITH IOL  . Total knee arthroplasty  05/27/2011    Procedure: TOTAL KNEE ARTHROPLASTY;  Surgeon: Shelda Pal, MD;  Location: [facility];  Service: Orthopedics;  Laterality: Left;  . Tendon repair Right 08/12/2012    Procedure: RIGHT TIB AND REPAIR VS RECONSTRUCTION POSSIBLE EHL  TRANSFER AND GASTROC RECESSION;  Surgeon: Toni Arthurs, MD;  Location: [facility];  Service: Orthopedics;  Laterality: Right;    Current Medications: Outpatient Prescriptions Prior to Visit  Medication Sig Dispense Refill  . Calcium-Vitamin D-Vitamin K (CALCIUM + D + K PO) Take 1 tablet by mouth daily.     . Multiple Vitamin (MULITIVITAMIN WITH MINERALS) TABS Take 1 tablet by mouth daily.     Marland Kitchen aspirin 81 MG tablet Take 81 mg by mouth daily.    . metoprolol succinate (TOPROL-XL) 25 MG 24 hr tablet TAKE 1 TABLET DAILY WITH OR IMMEDIATELY FOLLOWING A MEAL. 90 tablet 0   No facility-administered medications prior to visit.     Allergies:   Review of patient's allergies indicates no known allergies.   Social History   Social History  . Marital Status: Widowed    Spouse Name: N/A  . Number of Children: N/A  . Years of Education: N/A   Social History Main Topics  . Smoking status: Never Smoker   . Smokeless tobacco: Never Used  . Alcohol Use: 3.6 oz/week    3 Glasses of wine, 3 Shots of liquor per week  . Drug  Use: No  . Sexual Activity: Not Asked   Other Topics Concern  . None   Social History Narrative     Family History:  The patient's family history includes Atrial fibrillation in her sister, sister, and sister; Cancer in her brother; Colon cancer in her brother; Heart disease in her mother; Lung cancer in her father and sister; Lung disease in her brother; Osteoporosis in her mother. There is no history of Anemia, Arrhythmia, Asthma, Clotting disorder, Fainting, Heart attack, Heart failure, Hyperlipidemia, or Hypertension.   ROS:   Please see the history of present illness.    ROS All other systems reviewed and are negative.   PHYSICAL EXAM:   VS:  BP 140/82 mmHg  Pulse 56  Ht  (1.6 m)  Wt 129 lb 12.8 oz (58.877 kg)  BMI 23.00 kg/m2   GEN: Well nourished, well developed, in no acute distressAppears much younger than her stated age HEENT:  normal Neck: no JVD, carotid bruits, or masses Cardiac: RRR; no murmurs, rubs, or gallops,no edema  Respiratory:  clear to auscultation bilaterally, normal work of breathing GI: soft, nontender, nondistended, + BS MS: no deformity or atrophy Skin: warm and dry, no rash Neuro:  Alert and Oriented x 3, Strength and sensation are intact Psych: euthymic mood, full affect  Wt Readings from Last 3 Encounters:  10/18/15 129 lb 12.8 oz (58.877 kg)  09/25/14 134 lb 12.8 oz (61.145 kg)  03/28/14 134 lb (60.782 kg)      Studies/Labs Reviewed:   EKG:  EKG is ordered today.  The ekg ordered today demonstrates sinus brady.   Recent Labs: No results found for requested labs within last 365 days.   Lipid Panel No results found for: CHOL, TRIG, HDL, CHOLHDL, VLDL, LDLCALC, LDLDIRECT  Additional studies/ records that were reviewed today include:   Echocardiogram: 08/02/12-normal ejection fraction, mild AI/TR/MR    ASSESSMENT & PLAN:    Palpitations/PSVT: doing very well on low-dose Toprol XL  daily. Will provide refills today.   Mild MR/AR: stable, should be of no clinical consequence. Will update 2D ECHO with cough when lying flat.  Cough at night: She has had a cough at night that keeps her up and is productive of mildly brown to red sputum. It is worse when she is laying flat and is very bothersome to her. Will order a chest x-ray, BNP, BMP, CBC. He has no signs and symptoms of volume overload but will check a 2-D echo. If all this returns normal we may want her to see her primary care provider or pulmonologist.  Medication Adjustments/Labs and Tests Ordered: Current medicines are reviewed at length with the patient today.  Concerns regarding medicines are outlined above.  Medication changes, Labs and Tests ordered today are listed in the Patient Instructions below. Patient Instructions  Medication Instructions:  Your physician recommends that you continue on your current  medications as directed. Please refer to the Current Medication list given to you today.    Labwork: TODAY:  BMP, CBC, & BNP  Testing/Procedures: A chest x-ray takes a picture of the organs and structures inside the chest, including the heart, lungs, and blood vessels. This test can show several things, including, whether the heart is enlarges; whether fluid is building up in the lungs; and whether pacemaker / defibrillator leads are still in place.  Your physician has requested that you have an echocardiogram. Echocardiography is a painless test that uses sound waves to create images of your heart. It  provides your doctor with information about the size and shape of your heart and how well your heart's chambers and valves are working. This procedure takes approximately one hour. There are no restrictions for this procedure.    Follow-Up: Your physician wants you to follow-up in:  1 YEAR WITH DR. Anne Fu  You will receive a reminder letter in the mail two months in advance. If you don't receive a letter, please call our office to schedule the follow-up appointment.    Any Other Special Instructions Will Be Listed Below (If Applicable).  Echocardiogram An echocardiogram, or echocardiography, uses sound waves (ultrasound) to produce an image of your heart. The echocardiogram is simple, painless, obtained within a short period of time, and offers valuable information to your health care provider. The images from an echocardiogram can provide information such as:  Evidence of coronary artery disease (CAD).  Heart size.  Heart muscle function.  Heart valve function.  Aneurysm detection.  Evidence of a past heart attack.  Fluid buildup around the heart.  Heart muscle thickening.  Assess heart valve function. LET [facility] CARE PROVIDER KNOW ABOUT:  Any allergies you have.  All medicines you are taking, including vitamins, herbs, eye drops, creams, and over-the-counter  medicines.  Previous problems you or members of your family have had with the use of anesthetics.  Any blood disorders you have.  Previous surgeries you have had.  Medical conditions you have.  Possibility of pregnancy, if this applies. BEFORE THE PROCEDURE  No special preparation is needed. Eat and drink normally.  PROCEDURE   In order to produce an image of your heart, gel will be applied to your chest and a wand-like tool (transducer) will be moved over your chest. The gel will help transmit the sound waves from the transducer. The sound waves will harmlessly bounce off your heart to allow the heart images to be captured in real-time motion. These images will then be recorded.  You may need an IV to receive a medicine that improves the quality of the pictures. AFTER THE PROCEDURE You may return to your normal schedule including diet, activities, and medicines, unless your health care provider tells you otherwise.   This information is not intended to replace advice given to you by your health care provider. Make sure you discuss any questions you have with your health care provider.   Document Released: 04/25/2000 Document Revised: 05/19/2014 Document Reviewed: 01/03/2013 Elsevier Interactive Patient Education 2016 [employer].  X-rays X-rays are tests that create pictures of the inside of your body using radiation. Different body parts absorb different amounts of radiation, which show up on the X-ray pictures in shades of black, gray, and white.  X-rays are used to look for many health conditions, including broken bones, lung problems, and some types of cancer.  LET [facility] CARE PROVIDER KNOW ABOUT:  Any allergies you have.  All medicines you are taking, including vitamins, herbs, eye drops, creams, and over-the-counter medicines.  Previous surgeries you have had.  Medical conditions you have. RISK AND COMPLICATIONS Getting an X-ray is a safe procedure.  BEFORE THE  PROCEDURE  Tell the X-ray technician if you are pregnant or might be pregnant.  You may be asked to wear a protective lead apron to hide parts of your body from the X-ray.  You usually will need to undress whatever part of your body needs the X-ray. You will be given a hospital gown to wear, if needed.  You may need to  remove your glasses, jewelry, and other metal objects. PROCEDURE   The X-ray machine creates a picture by using a tiny burst of radiation. It is painless.  You may need to have several pictures taken at different angles.  You will need to try to be as still as you can during the examination to get the best possible images. AFTER THE PROCEDURE  You will be able to resume your normal activities.  The X-ray will be examined by your health care provider or a radiology specialist.  It is your responsibility to get your test results. Ask your health care provider when to expect your results and how to get them.   This information is not intended to replace advice given to you by your health care provider. Make sure you discuss any questions you have with your health care provider.   Document Released: 04/28/2005 Document Revised: 05/19/2014 Document Reviewed: 06/22/2013 Elsevier Interactive Patient Education [employer] Inc[employer]     If you need a refill on your cardiac medications before your next appointment, please call your pharmacy.       Signed, Cline CrockKathryn Landis Cassaro, PA-C  10/18/2015 4:25 PM    [facility] [address], [place], [place]  [address] Phone: [phone](336) [phone]; Fax: [phone]

## 2015-10-18 ENCOUNTER — Encounter: Payer: Self-pay | Admitting: Physician Assistant

## 2015-10-18 ENCOUNTER — Ambulatory Visit (INDEPENDENT_AMBULATORY_CARE_PROVIDER_SITE_OTHER): Payer: Medicare Other | Admitting: Physician Assistant

## 2015-10-18 VITALS — BP 140/82 | HR 56 | Ht 63.0 in | Wt 129.8 lb

## 2015-10-18 DIAGNOSIS — R0601 Orthopnea: Secondary | ICD-10-CM | POA: Diagnosis not present

## 2015-10-18 DIAGNOSIS — I351 Nonrheumatic aortic (valve) insufficiency: Secondary | ICD-10-CM | POA: Diagnosis not present

## 2015-10-18 DIAGNOSIS — I471 Supraventricular tachycardia: Secondary | ICD-10-CM

## 2015-10-18 DIAGNOSIS — R059 Cough, unspecified: Secondary | ICD-10-CM

## 2015-10-18 DIAGNOSIS — R002 Palpitations: Secondary | ICD-10-CM

## 2015-10-18 DIAGNOSIS — I340 Nonrheumatic mitral (valve) insufficiency: Secondary | ICD-10-CM

## 2015-10-18 DIAGNOSIS — R030 Elevated blood-pressure reading, without diagnosis of hypertension: Secondary | ICD-10-CM | POA: Diagnosis not present

## 2015-10-18 DIAGNOSIS — R05 Cough: Secondary | ICD-10-CM

## 2015-10-18 DIAGNOSIS — IMO0001 Reserved for inherently not codable concepts without codable children: Secondary | ICD-10-CM

## 2015-10-18 DIAGNOSIS — I359 Nonrheumatic aortic valve disorder, unspecified: Secondary | ICD-10-CM | POA: Diagnosis not present

## 2015-10-18 LAB — CBC
HEMATOCRIT: 43.2 % (ref 35.0–45.0)
Hemoglobin: 14.5 g/dL (ref 11.7–15.5)
MCH: 29.7 pg (ref 27.0–33.0)
MCHC: 33.6 g/dL (ref 32.0–36.0)
MCV: 88.5 fL (ref 80.0–100.0)
MPV: 9.9 fL (ref 7.5–12.5)
PLATELETS: 380 10*3/uL (ref 140–400)
RBC: 4.88 MIL/uL (ref 3.80–5.10)
RDW: 13.7 % (ref 11.0–15.0)
WBC: 7.6 10*3/uL (ref 3.8–10.8)

## 2015-10-18 MED ORDER — METOPROLOL SUCCINATE ER 25 MG PO TB24: ORAL_TABLET | ORAL | Status: DC

## 2015-10-18 NOTE — Patient Instructions (Addendum)
Medication Instructions:  Your physician recommends that you continue on your current medications as directed. Please refer to the Current Medication list given to you today.    Labwork: TODAY:  BMP, CBC, & BNP  Testing/Procedures: A chest x-ray takes a picture of the organs and structures inside the chest, including the heart, lungs, and blood vessels. This test can show several things, including, whether the heart is enlarges; whether fluid is building up in the lungs; and whether pacemaker / defibrillator leads are still in place.  Your physician has requested that you have an echocardiogram. Echocardiography is a painless test that uses sound waves to create images of your heart. It provides your doctor with information about the size and shape of your heart and how well your heart's chambers and valves are working. This procedure takes approximately one hour. There are no restrictions for this procedure.    Follow-Up: Your physician wants you to follow-up in:  1 YEAR WITH DR. Anne FuSKAINS  You will receive a reminder letter in the mail two months in advance. If you don't receive a letter, please call our office to schedule the follow-up appointment.    Any Other Special Instructions Will Be Listed Below (If Applicable).  Echocardiogram An echocardiogram, or echocardiography, uses sound waves (ultrasound) to produce an image of your heart. The echocardiogram is simple, painless, obtained within a short period of time, and offers valuable information to your health care provider. The images from an echocardiogram can provide information such as:  Evidence of coronary artery disease (CAD).  Heart size.  Heart muscle function.  Heart valve function.  Aneurysm detection.  Evidence of a past heart attack.  Fluid buildup around the heart.  Heart muscle thickening.  Assess heart valve function. LET [facility]YOUR HEALTH CARE PROVIDER KNOW ABOUT:  Any allergies you have.  All medicines you  are taking, including vitamins, herbs, eye drops, creams, and over-the-counter medicines.  Previous problems you or members of your family have had with the use of anesthetics.  Any blood disorders you have.  Previous surgeries you have had.  Medical conditions you have.  Possibility of pregnancy, if this applies. BEFORE THE PROCEDURE  No special preparation is needed. Eat and drink normally.  PROCEDURE   In order to produce an image of your heart, gel will be applied to your chest and a wand-like tool (transducer) will be moved over your chest. The gel will help transmit the sound waves from the transducer. The sound waves will harmlessly bounce off your heart to allow the heart images to be captured in real-time motion. These images will then be recorded.  You may need an IV to receive a medicine that improves the quality of the pictures. AFTER THE PROCEDURE You may return to your normal schedule including diet, activities, and medicines, unless your health care provider tells you otherwise.   This information is not intended to replace advice given to you by your health care provider. Make sure you discuss any questions you have with your health care provider.   Document Released: 04/25/2000 Document Revised: 05/19/2014 Document Reviewed: 01/03/2013 Elsevier Interactive Patient Education 2016 [employer]  X-rays X-rays are tests that create pictures of the inside of your body using radiation. Different body parts absorb different amounts of radiation, which show up on the X-ray pictures in shades of black, gray, and white.  X-rays are used to look for many health conditions, including broken bones, lung problems, and some types of cancer.  LET YOUR  HEALTH CARE PROVIDER KNOW ABOUT:  Any allergies you have.  All medicines you are taking, including vitamins, herbs, eye drops, creams, and over-the-counter medicines.  Previous surgeries you have had.  Medical conditions you  have. RISK AND COMPLICATIONS Getting an X-ray is a safe procedure.  BEFORE THE PROCEDURE  Tell the X-ray technician if you are pregnant or might be pregnant.  You may be asked to wear a protective lead apron to hide parts of your body from the X-ray.  You usually will need to undress whatever part of your body needs the X-ray. You will be given a hospital gown to wear, if needed.  You may need to remove your glasses, jewelry, and other metal objects. PROCEDURE   The X-ray machine creates a picture by using a tiny burst of radiation. It is painless.  You may need to have several pictures taken at different angles.  You will need to try to be as still as you can during the examination to get the best possible images. AFTER THE PROCEDURE  You will be able to resume your normal activities.  The X-ray will be examined by your health care provider or a radiology specialist.  It is your responsibility to get your test results. Ask your health care provider when to expect your results and how to get them.   This information is not intended to replace advice given to you by your health care provider. Make sure you discuss any questions you have with your health care provider.   Document Released: 04/28/2005 Document Revised: 05/19/2014 Document Reviewed: 06/22/2013 Elsevier Interactive Patient Education [employer]     If you need a refill on your cardiac medications before your next appointment, please call your pharmacy.

## 2015-10-19 ENCOUNTER — Ambulatory Visit
Admission: RE | Admit: 2015-10-19 | Discharge: 2015-10-19 | Disposition: A | Discharge status: Home / Self Care | Payer: Medicare Other | Source: Ambulatory Visit | Attending: Physician Assistant | Admitting: Physician Assistant

## 2015-10-19 DIAGNOSIS — I351 Nonrheumatic aortic (valve) insufficiency: Secondary | ICD-10-CM

## 2015-10-19 DIAGNOSIS — R05 Cough: Secondary | ICD-10-CM | POA: Diagnosis not present

## 2015-10-19 LAB — BASIC METABOLIC PANEL
BUN: 15 mg/dL (ref 7–25)
CHLORIDE: 103 mmol/L (ref 98–110)
CO2: 25 mmol/L (ref 20–31)
Calcium: 9.8 mg/dL (ref 8.6–10.4)
Creat: 0.77 mg/dL (ref 0.60–0.88)
Glucose, Bld: 82 mg/dL (ref 65–99)
POTASSIUM: 4.4 mmol/L (ref 3.5–5.3)
Sodium: 141 mmol/L (ref 135–146)

## 2015-10-19 LAB — BRAIN NATRIURETIC PEPTIDE: Brain Natriuretic Peptide: 126.1 pg/mL — ABNORMAL HIGH (ref <100)

## 2015-11-14 ENCOUNTER — Telehealth: Payer: Self-pay | Admitting: Cardiology

## 2015-11-14 ENCOUNTER — Other Ambulatory Visit: Payer: Self-pay

## 2015-11-14 ENCOUNTER — Ambulatory Visit (HOSPITAL_COMMUNITY): Payer: Medicare Other | Attending: Cardiovascular Disease

## 2015-11-14 DIAGNOSIS — I351 Nonrheumatic aortic (valve) insufficiency: Secondary | ICD-10-CM | POA: Insufficient documentation

## 2015-11-14 DIAGNOSIS — I340 Nonrheumatic mitral (valve) insufficiency: Secondary | ICD-10-CM | POA: Insufficient documentation

## 2015-11-14 DIAGNOSIS — I517 Cardiomegaly: Secondary | ICD-10-CM | POA: Diagnosis not present

## 2015-11-14 DIAGNOSIS — E785 Hyperlipidemia, unspecified: Secondary | ICD-10-CM | POA: Insufficient documentation

## 2015-11-14 LAB — ECHOCARDIOGRAM COMPLETE
Ao-asc: 35 cm
E decel time: 384 ms
E/e' ratio: 9.02
FS: 41 % (ref 28–44)
IVS/LV PW RATIO, ED: 1.32
LA ID, A-P, ES: 32 mm
LA diam end sys: 32 mm
LA diam index: 1.97 cm/m2
LA vol A4C: 51 mL
LA vol index: 30.2 mL/m2
LA vol: 49 mL
LV E/e' medial: 9.02
LV E/e'average: 9.02
LV PW d: 9.25 mm — AB (ref 0.6–1.1)
LV dias vol index: 37 mL/m2
LV dias vol: 60 mL (ref 46–106)
LV e' LATERAL: 6.73 cm/s
LV sys vol index: 17 mL/m2
LV sys vol: 27 mL (ref 14–42)
LVOT SV: 64 mL
LVOT VTI: 20.5 cm
LVOT area: 3.14 cm2
LVOT diameter: 20 mm
LVOT peak grad rest: 3 mmHg
LVOT peak vel: 85.7 cm/s
Lateral S' vel: 13.5 cm/s
MV Dec: 384
MV pk A vel: 98.2 m/s
MV pk E vel: 60.7 m/s
P 1/2 time: 509 ms
RV sys press: 23 mmHg
Reg peak vel: 224 cm/s
Simpson's disk: 55
Stroke v: 33 mL
TDI e' lateral: 6.73
TDI e' medial: 4.58
TR max vel: 224 cm/s

## 2015-11-14 NOTE — Telephone Encounter (Signed)
Follow Up:  Returning call,she did not know who called. She thinks it was about her test results.

## 2015-11-15 NOTE — Telephone Encounter (Signed)
Pt and daughter have been made aware of pt echo results and verbalized understanding.

## 2016-01-04 DIAGNOSIS — H903 Sensorineural hearing loss, bilateral: Secondary | ICD-10-CM | POA: Diagnosis not present

## 2016-02-27 DIAGNOSIS — Z23 Encounter for immunization: Secondary | ICD-10-CM | POA: Diagnosis not present

## 2016-05-26 DIAGNOSIS — H26492 Other secondary cataract, left eye: Secondary | ICD-10-CM | POA: Diagnosis not present

## 2016-05-26 DIAGNOSIS — Z961 Presence of intraocular lens: Secondary | ICD-10-CM | POA: Diagnosis not present

## 2016-05-26 DIAGNOSIS — H353132 Nonexudative age-related macular degeneration, bilateral, intermediate dry stage: Secondary | ICD-10-CM | POA: Diagnosis not present

## 2016-05-26 DIAGNOSIS — H52203 Unspecified astigmatism, bilateral: Secondary | ICD-10-CM | POA: Diagnosis not present

## 2016-08-15 DIAGNOSIS — R002 Palpitations: Secondary | ICD-10-CM | POA: Diagnosis not present

## 2016-08-15 DIAGNOSIS — K224 Dyskinesia of esophagus: Secondary | ICD-10-CM | POA: Diagnosis not present

## 2016-08-15 DIAGNOSIS — Z0000 Encounter for general adult medical examination without abnormal findings: Secondary | ICD-10-CM | POA: Diagnosis not present

## 2016-08-15 DIAGNOSIS — H9193 Unspecified hearing loss, bilateral: Secondary | ICD-10-CM | POA: Diagnosis not present

## 2016-08-15 DIAGNOSIS — Z1389 Encounter for screening for other disorder: Secondary | ICD-10-CM | POA: Diagnosis not present

## 2016-08-15 DIAGNOSIS — M179 Osteoarthritis of knee, unspecified: Secondary | ICD-10-CM | POA: Diagnosis not present

## 2016-08-15 DIAGNOSIS — E7800 Pure hypercholesterolemia, unspecified: Secondary | ICD-10-CM | POA: Diagnosis not present

## 2016-08-15 DIAGNOSIS — M8580 Other specified disorders of bone density and structure, unspecified site: Secondary | ICD-10-CM | POA: Diagnosis not present

## 2016-09-28 IMAGING — CR DG CHEST 2V
2 series · 2 of 2 positions shown · non-contrast
Comparison: November 15, 2010

CLINICAL DATA: Mild aortic regurgitation.  Productive cough.

EXAM:
CHEST  2 VIEW

[w chest lat]
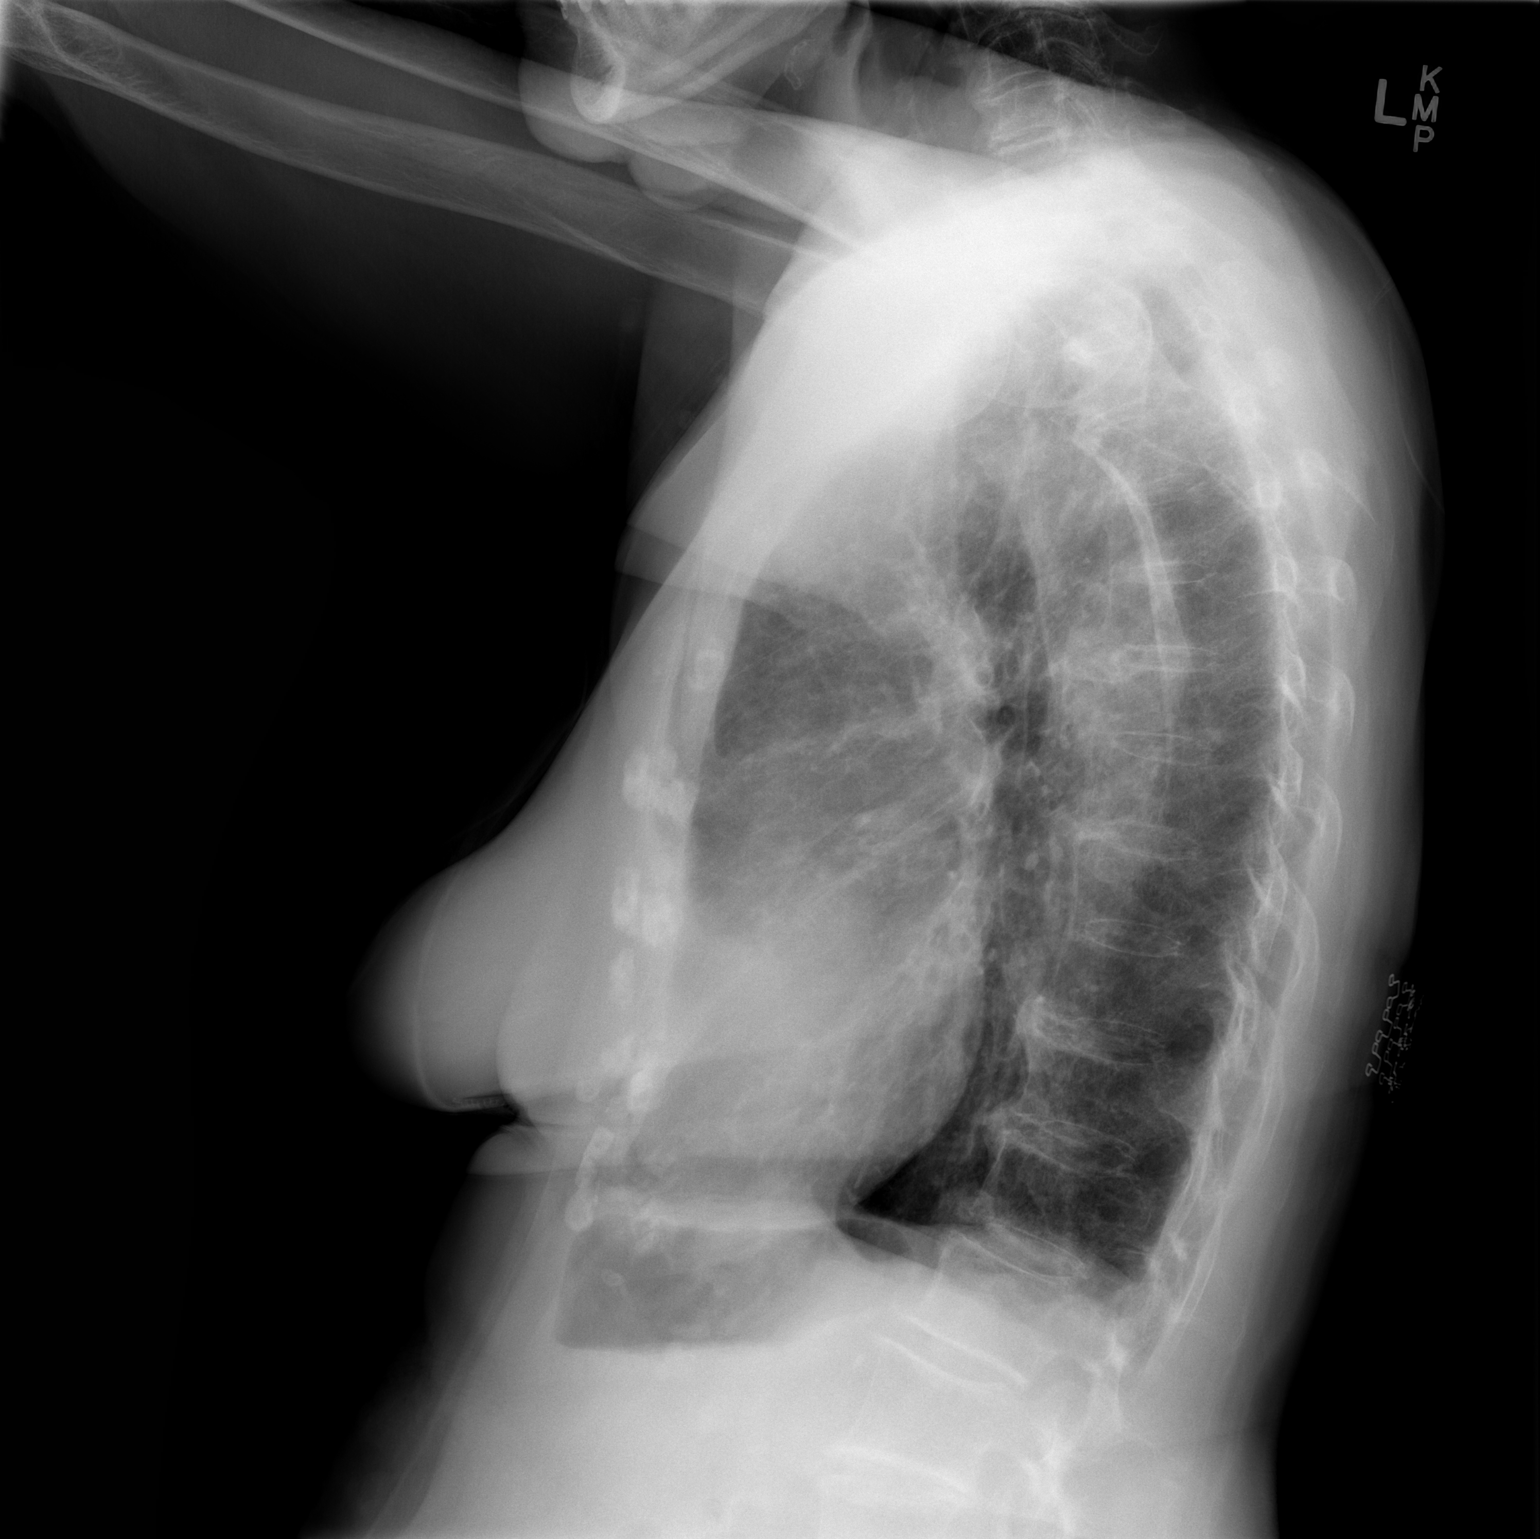

[w chest pa]
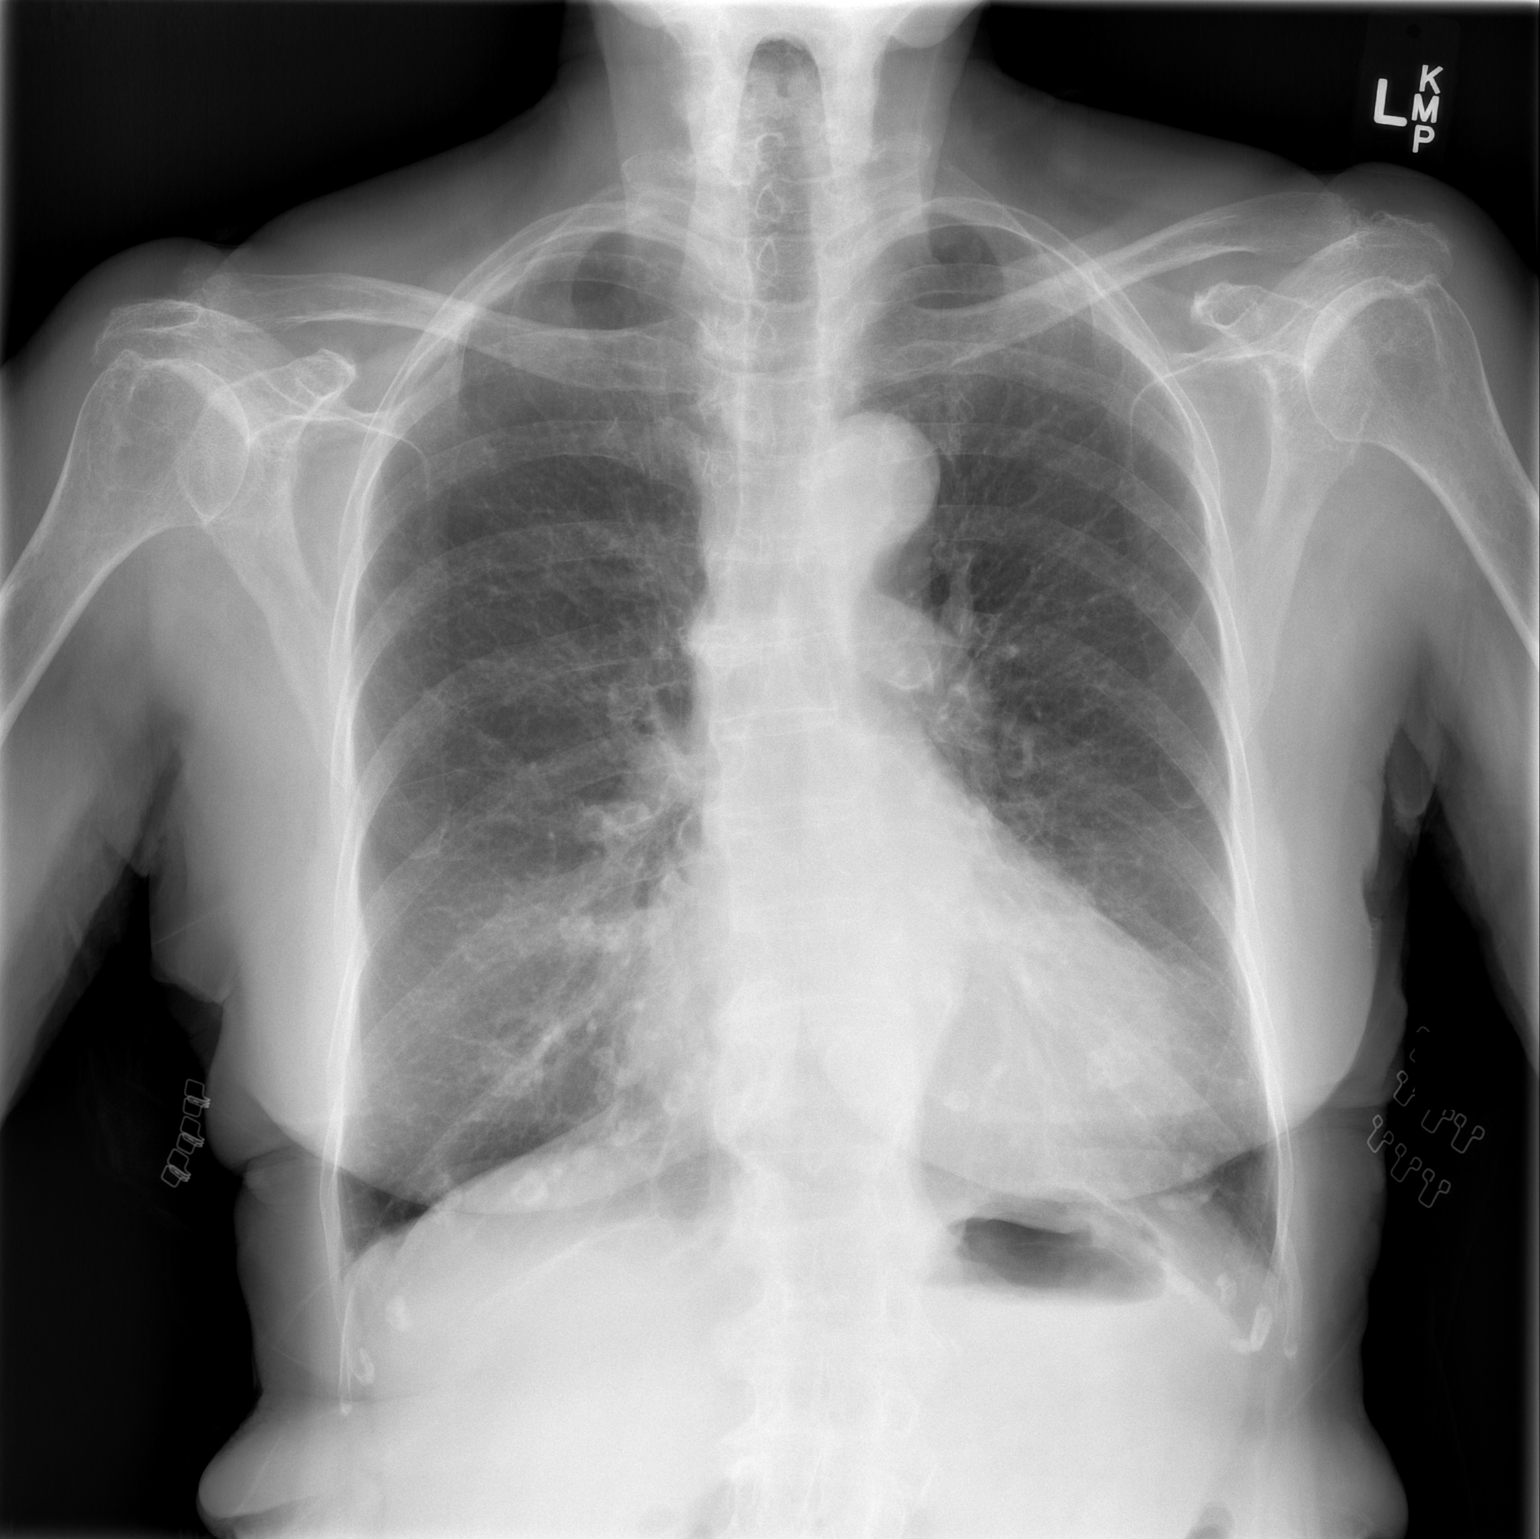

[2 of 2 positions shown; findings below may reference images not displayed]

FINDINGS: The heart, hila, mediastinum, lungs, and pleura are unchanged with
no acute abnormalities identified.
IMPRESSION: No active cardiopulmonary disease.

## 2016-10-23 ENCOUNTER — Encounter: Payer: Self-pay | Admitting: Cardiology

## 2016-10-23 ENCOUNTER — Ambulatory Visit (INDEPENDENT_AMBULATORY_CARE_PROVIDER_SITE_OTHER): Payer: Medicare Other | Admitting: Cardiology

## 2016-10-23 VITALS — BP 138/76 | HR 66 | Ht 63.0 in | Wt 128.4 lb

## 2016-10-23 DIAGNOSIS — I340 Nonrheumatic mitral (valve) insufficiency: Secondary | ICD-10-CM

## 2016-10-23 DIAGNOSIS — R002 Palpitations: Secondary | ICD-10-CM | POA: Diagnosis not present

## 2016-10-23 DIAGNOSIS — I471 Supraventricular tachycardia: Secondary | ICD-10-CM

## 2016-10-23 MED ORDER — METOPROLOL SUCCINATE ER 50 MG PO TB24: ORAL_TABLET | ORAL | 3 refills | Status: DC

## 2016-10-23 NOTE — Patient Instructions (Addendum)
  Medication Instructions:  Please increase your Metoprolol to 50 mg a day. Continue all other medications as listed.  Follow-Up: Follow up in 3 months with Carlean JewsKatie Thompson, PA.  You will receive a letter in the mail 2 months before you are due.  Please call us when you receive this letter to schedule your follow up appointment.   If you need a refill on your cardiac medications before your next appointment, please call your pharmacy.  Thank you for choosing [facility]!!
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
## 2017-02-17 DIAGNOSIS — Z23 Encounter for immunization: Secondary | ICD-10-CM | POA: Diagnosis not present

## 2017-02-18 DIAGNOSIS — H903 Sensorineural hearing loss, bilateral: Secondary | ICD-10-CM | POA: Diagnosis not present

## 2017-05-26 DIAGNOSIS — H52203 Unspecified astigmatism, bilateral: Secondary | ICD-10-CM | POA: Diagnosis not present

## 2017-05-26 DIAGNOSIS — H353132 Nonexudative age-related macular degeneration, bilateral, intermediate dry stage: Secondary | ICD-10-CM | POA: Diagnosis not present

## 2017-05-26 DIAGNOSIS — Z961 Presence of intraocular lens: Secondary | ICD-10-CM | POA: Diagnosis not present

## 2017-08-25 DIAGNOSIS — M8580 Other specified disorders of bone density and structure, unspecified site: Secondary | ICD-10-CM | POA: Diagnosis not present

## 2017-08-25 DIAGNOSIS — Z1389 Encounter for screening for other disorder: Secondary | ICD-10-CM | POA: Diagnosis not present

## 2017-08-25 DIAGNOSIS — H9193 Unspecified hearing loss, bilateral: Secondary | ICD-10-CM | POA: Diagnosis not present

## 2017-08-25 DIAGNOSIS — M179 Osteoarthritis of knee, unspecified: Secondary | ICD-10-CM | POA: Diagnosis not present

## 2017-08-25 DIAGNOSIS — K224 Dyskinesia of esophagus: Secondary | ICD-10-CM | POA: Diagnosis not present

## 2017-08-25 DIAGNOSIS — R002 Palpitations: Secondary | ICD-10-CM | POA: Diagnosis not present

## 2017-08-25 DIAGNOSIS — E7800 Pure hypercholesterolemia, unspecified: Secondary | ICD-10-CM | POA: Diagnosis not present

## 2017-08-25 DIAGNOSIS — R413 Other amnesia: Secondary | ICD-10-CM | POA: Diagnosis not present

## 2017-08-25 DIAGNOSIS — Z0000 Encounter for general adult medical examination without abnormal findings: Secondary | ICD-10-CM | POA: Diagnosis not present

## 2017-08-25 DIAGNOSIS — F068 Other specified mental disorders due to known physiological condition: Secondary | ICD-10-CM | POA: Diagnosis not present

## 2017-10-02 DIAGNOSIS — S76011A Strain of muscle, fascia and tendon of right hip, initial encounter: Secondary | ICD-10-CM | POA: Diagnosis not present

## 2017-10-02 DIAGNOSIS — M5431 Sciatica, right side: Secondary | ICD-10-CM | POA: Diagnosis not present

## 2017-10-12 ENCOUNTER — Other Ambulatory Visit: Payer: Self-pay | Admitting: Nurse Practitioner

## 2017-10-12 ENCOUNTER — Ambulatory Visit
Admission: RE | Admit: 2017-10-12 | Discharge: 2017-10-12 | Disposition: A | Discharge status: Home / Self Care | Payer: Medicare Other | Source: Ambulatory Visit | Attending: Nurse Practitioner | Admitting: Nurse Practitioner

## 2017-10-12 DIAGNOSIS — M5431 Sciatica, right side: Secondary | ICD-10-CM | POA: Diagnosis not present

## 2017-10-12 DIAGNOSIS — M1611 Unilateral primary osteoarthritis, right hip: Secondary | ICD-10-CM | POA: Diagnosis not present

## 2017-10-12 DIAGNOSIS — S76011A Strain of muscle, fascia and tendon of right hip, initial encounter: Secondary | ICD-10-CM | POA: Diagnosis not present

## 2017-10-12 DIAGNOSIS — M8580 Other specified disorders of bone density and structure, unspecified site: Secondary | ICD-10-CM | POA: Diagnosis not present

## 2017-12-06 ENCOUNTER — Other Ambulatory Visit: Payer: Self-pay | Admitting: Cardiology

## 2017-12-11 ENCOUNTER — Encounter: Payer: Self-pay | Admitting: Cardiology

## 2017-12-11 ENCOUNTER — Ambulatory Visit (INDEPENDENT_AMBULATORY_CARE_PROVIDER_SITE_OTHER): Payer: Medicare Other | Admitting: Cardiology

## 2017-12-11 ENCOUNTER — Encounter (INDEPENDENT_AMBULATORY_CARE_PROVIDER_SITE_OTHER): Payer: Self-pay

## 2017-12-11 VITALS — BP 118/70 | HR 93 | Ht 63.0 in | Wt 122.8 lb

## 2017-12-11 DIAGNOSIS — R002 Palpitations: Secondary | ICD-10-CM

## 2017-12-11 DIAGNOSIS — I471 Supraventricular tachycardia: Secondary | ICD-10-CM | POA: Diagnosis not present

## 2017-12-11 MED ORDER — METOPROLOL SUCCINATE ER 50 MG PO TB24: 50.0000 mg | ORAL_TABLET | Freq: Every day | ORAL | 3 refills | Status: DC
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
## 2017-12-11 NOTE — Patient Instructions (Signed)
Medication Instructions:  The current medical regimen is effective;  continue present plan and medications.  Follow-Up: Follow up in 6 months with Laura Ingold, NP  You will receive a letter in the mail 2 months before you are due.  Please call us when you receive this letter to schedule your follow up appointment.  If you need a refill on your cardiac medications before your next appointment, please call your pharmacy.  Thank you for choosing [facility]!!     

## 2018-06-01 DIAGNOSIS — Z961 Presence of intraocular lens: Secondary | ICD-10-CM | POA: Diagnosis not present

## 2018-06-01 DIAGNOSIS — H52203 Unspecified astigmatism, bilateral: Secondary | ICD-10-CM | POA: Diagnosis not present

## 2018-06-01 DIAGNOSIS — H353131 Nonexudative age-related macular degeneration, bilateral, early dry stage: Secondary | ICD-10-CM | POA: Diagnosis not present

## 2018-06-03 DIAGNOSIS — R413 Other amnesia: Secondary | ICD-10-CM | POA: Diagnosis not present

## 2018-07-07 DIAGNOSIS — R41841 Cognitive communication deficit: Secondary | ICD-10-CM | POA: Diagnosis not present

## 2018-07-09 DIAGNOSIS — R41841 Cognitive communication deficit: Secondary | ICD-10-CM | POA: Diagnosis not present

## 2018-07-12 DIAGNOSIS — R41841 Cognitive communication deficit: Secondary | ICD-10-CM | POA: Diagnosis not present

## 2018-07-14 DIAGNOSIS — R41841 Cognitive communication deficit: Secondary | ICD-10-CM | POA: Diagnosis not present

## 2018-07-27 DIAGNOSIS — R41841 Cognitive communication deficit: Secondary | ICD-10-CM | POA: Diagnosis not present

## 2018-07-29 DIAGNOSIS — R41841 Cognitive communication deficit: Secondary | ICD-10-CM | POA: Diagnosis not present

## 2018-08-27 ENCOUNTER — Other Ambulatory Visit: Payer: Self-pay

## 2018-08-27 ENCOUNTER — Encounter: Payer: Self-pay | Admitting: Cardiology

## 2018-08-27 ENCOUNTER — Telehealth (INDEPENDENT_AMBULATORY_CARE_PROVIDER_SITE_OTHER): Payer: Medicare Other | Admitting: Cardiology

## 2018-08-27 VITALS — Ht 63.0 in | Wt 120.0 lb

## 2018-08-27 DIAGNOSIS — I340 Nonrheumatic mitral (valve) insufficiency: Secondary | ICD-10-CM | POA: Diagnosis not present

## 2018-08-27 DIAGNOSIS — I471 Supraventricular tachycardia: Secondary | ICD-10-CM | POA: Diagnosis not present

## 2018-08-27 NOTE — Patient Instructions (Signed)
Medication Instructions:  Your physician recommends that you continue on your current medications as directed. Please refer to the Current Medication list given to you today.  If you need a refill on your cardiac medications before your next appointment, please call your pharmacy.   Lab work: None ordered  If you have labs (blood work) drawn today and your tests are completely normal, you will receive your results only by: . MyChart Message (if you have MyChart) OR . A paper copy in the mail If you have any lab test that is abnormal or we need to change your treatment, we will call you to review the results.  Testing/Procedures: None ordered  Follow-Up: At [facility], you and your health needs are our priority.  As part of our continuing mission to provide you with exceptional heart care, we have created designated Provider Care Teams.  These Care Teams include your primary Cardiologist (physician) and Advanced Practice Providers (APPs -  Physician Assistants and Nurse Practitioners) who all work together to provide you with the care you need, when you need it. You will need a follow up appointment in 6 months.  Please call our office 2 months in advance to schedule this appointment.  You may see Mark Skains, MD or one of the following Advanced Practice Providers on your designated Care Team:   Lori Gerhardt, NP Laura Ingold, NP . Jill McDaniel, NP  Any Other Special Instructions Will Be Listed Below (If Applicable).    

## 2018-08-27 NOTE — Progress Notes (Signed)
Virtual Visit via Telephone Note   This visit type was conducted due to national recommendations for restrictions regarding the COVID-19 Pandemic (e.g. social distancing) in an effort to limit this patient's exposure and mitigate transmission in our community.  Due to her co-morbid illnesses, this patient is at least at moderate risk for complications without adequate follow up.  This format is felt to be most appropriate for this patient at this time.  The patient did not have access to video technology/had technical difficulties with video requiring transitioning to audio format only (telephone).  All issues noted in this document were discussed and addressed.  No physical exam could be performed with this format.  Please refer to the patient's chart for her  consent to telehealth for Kimberly Donovan.  Pt could not manage computer or video call.  Evaluation Performed:  Follow-up visit  Date:  08/27/2018   ID:  Kimberly Donovan, DOB 03-15-1919, MRN [id number]  Patient Location:  [facility]  Provider Location: Office  PCP:  Kimberly Noble, MD  Cardiologist:  Kimberly Schultz, MD  Electrophysiologist:  None   Chief Complaint:  palpitations.  History of Present Illness:    Kimberly Donovan is a 85 y.o. female with hx of HLD, HTN, palpitations/PSVT, mild MR/AR - she is on low dose toprol. last saw Kimberly. Anne Donovan 12/11/17 and had noted increase of brief episodes of rapid HR.   They were able to document in office 184 BPM retrograde P wave, she had been off her toprol.   Last echo 11/2015 with EF 55-60% G1DD, mild AR, mod MR and MVP of ant and post leaflet.  Labs last Year HDL 66, LDL 110, Cr 0.690 TSH 1.560 per PCP  Today she is doing well, no chest pain or SOB.  She still has episodes of rapid HR brief in nature.  Maybe one a month if that.  Have asked her to continue her Toprol.  She could not take her BP but the nurse comes by the room and took it and said it was ok.  [name] moved to [place] about a month ago -she stated she sold her house and her car.  Now not much to do and she eats in the dining room.  So now she just reads and walks.  No colds or fevers.  She is careful about washing her hands and practices distancing.   No weakness, has a good appetite.      The patient does not have symptoms concerning for COVID-19 infection (fever, chills, cough, or new shortness of breath).    Past Medical History:  Diagnosis Date  . Arthritis   . Dyslipidemia   . Dysphagia   . Dysrhythmia    states has palpitations-w/u/ neg 2003 per note Kimberly Donovan  . H/O hiatal hernia    no meds  . HOH (hard of hearing)   . PONV (postoperative nausea and vomiting)   . Wears hearing aid    both ears   Past Surgical History:  Procedure Laterality Date  . ABDOMINAL HYSTERECTOMY     with BSO  . CARPAL TUNNEL RELEASE     bilateral  . EYE SURGERY     repair of lid left/BILATERAL CATARACT EXTRACTION WITH IOL  . JOINT REPLACEMENT     right total knee  2009  . RECTOCELE REPAIR    . TENDON REPAIR Right 08/12/2012   Procedure: RIGHT TIB AND REPAIR VS RECONSTRUCTION POSSIBLE EHL TRANSFER AND GASTROC RECESSION;  Surgeon: Kimberly Arthurs, MD;  Location: [facility];  Service: Orthopedics;  Laterality: Right;  . TOTAL KNEE ARTHROPLASTY  05/27/2011   Procedure: TOTAL KNEE ARTHROPLASTY;  Surgeon: Kimberly Pal, MD;  Location: [facility];  Service: Orthopedics;  Laterality: Left;  . TYMPANOPLASTY     right with skin grafting      No outpatient medications have been marked as taking for the 08/27/18 encounter (Appointment) with Kimberly Brand, NP.     Allergies:   Patient has no known allergies.   Social History   Tobacco Use  . Smoking status: Never Smoker  . Smokeless tobacco: Never Used  Substance Use Topics  . Alcohol use: Yes    Alcohol/week: 6.0 standard drinks    Types: 3 Glasses of wine, 3 Shots of liquor per week  . Drug use: No     Family Hx: The  patient's family history includes Atrial fibrillation in her sister, sister, and sister; Cancer in her brother; Colon cancer in her brother; Heart disease in her mother; Lung cancer in her father and sister; Lung disease in her brother; Osteoporosis in her mother. There is no history of Anemia, Arrhythmia, Asthma, Clotting disorder, Fainting, Heart attack, Heart failure, Hyperlipidemia, or Hypertension.  ROS:   Please see the history of present illness.    General:no colds or fevers, no weight changes Skin:no rashes or ulcers HEENT:no blurred vision, no congestion, she is hard of hearing CV:see HPI PUL:see HPI GI:no diarrhea constipation or melena, no indigestion GU:no hematuria, no dysuria MS:no joint pain, no claudication Neuro:no syncope, no lightheadedness Endo:no diabetes, no thyroid disease  All other systems reviewed and are negative.   Prior CV studies:   The following studies were reviewed today:  Echo 11/14/15  Study Conclusions  - Left ventricle: The cavity size was normal. Wall thickness was   increased in a pattern of mild LVH. Systolic function was normal.   The estimated ejection fraction was in the range of 55% to 60%.   Wall motion was normal; there were no regional wall motion   abnormalities. Doppler parameters are consistent with abnormal   left ventricular relaxation (grade 1 diastolic dysfunction).   Doppler parameters are consistent with elevated mean left atrial   filling pressure. - Aortic valve: There was mild regurgitation. - Mitral valve: Calcified annulus. Moderately thickened leaflets .   Moderate myxomatous degeneration. Mild, late systolicprolapse,   involving the anterior leaflet and the posterior leaflet. There   was moderate regurgitation directed centrally.   Labs/Other Tests and Data Reviewed:    EKG:  An ECG dated 12/11/17 at 1401 and at 1402 was personally reviewed today and demonstrated:  the first was SR with LAD and HR 93, the second  one was pSVT at 180 BPM  Recent Labs: No results found for requested labs within last 8760 hours.   Recent Lipid Panel No results found for: CHOL, TRIG, HDL, CHOLHDL, LDLCALC, LDLDIRECT  Wt Readings from Last 3 Encounters:  12/11/17 122 lb 12.8 oz (55.7 kg)  10/23/16 128 lb 6.4 oz (58.2 kg)  10/18/15 129 lb 12.8 oz (58.9 kg)     Objective:    Vital Signs:  LMP  (LMP Unknown)    VITAL SIGNS:  reviewed see above note in HPI Strong voice with minor tremor Lungs -able to speak in full sentences without SOB and no wheezing Neuro:  Alert and oriented X 3  Psych: pleasant affect.  ASSESSMENT & PLAN:    1.  PSVT still with occ episode brief maybe once a month instructed to continue to take Toprol XL.    2.  Mild MR/AR no SOB or chest pain.  She will follow up with Kimberly. Anne FuSkains in 6 months.     COVID-19 Education: The signs and symptoms of COVID-19 were discussed with the patient and how to seek care for testing (follow up with PCP or arrange E-visit).  The importance of social distancing was discussed today.  Time:   Today, I have spent 10 minutes with the patient with telehealth technology discussing the above problems.     Medication Adjustments/Labs and Tests Ordered: Current medicines are reviewed at length with the patient today.  Concerns regarding medicines are outlined above.   Tests Ordered: No orders of the defined types were placed in this encounter.   Medication Changes: No orders of the defined types were placed in this encounter.   Disposition:  Follow up in 6 month(s)  Signed, Nada BoozerLaura Ania Levay, NP  08/27/2018 8:25 AM    [facility]

## 2018-09-14 DIAGNOSIS — R41841 Cognitive communication deficit: Secondary | ICD-10-CM | POA: Diagnosis not present

## 2018-09-22 IMAGING — CR DG HIP (WITH OR WITHOUT PELVIS) 2-3V*R*
2 series · 2 of 2 positions shown · non-contrast
Comparison: None.

CLINICAL DATA: Right-sided hip pain for 2-3 weeks without known
injury.

EXAM:
DG HIP (WITH OR WITHOUT PELVIS) 2-3V RIGHT

[t hip ap right]
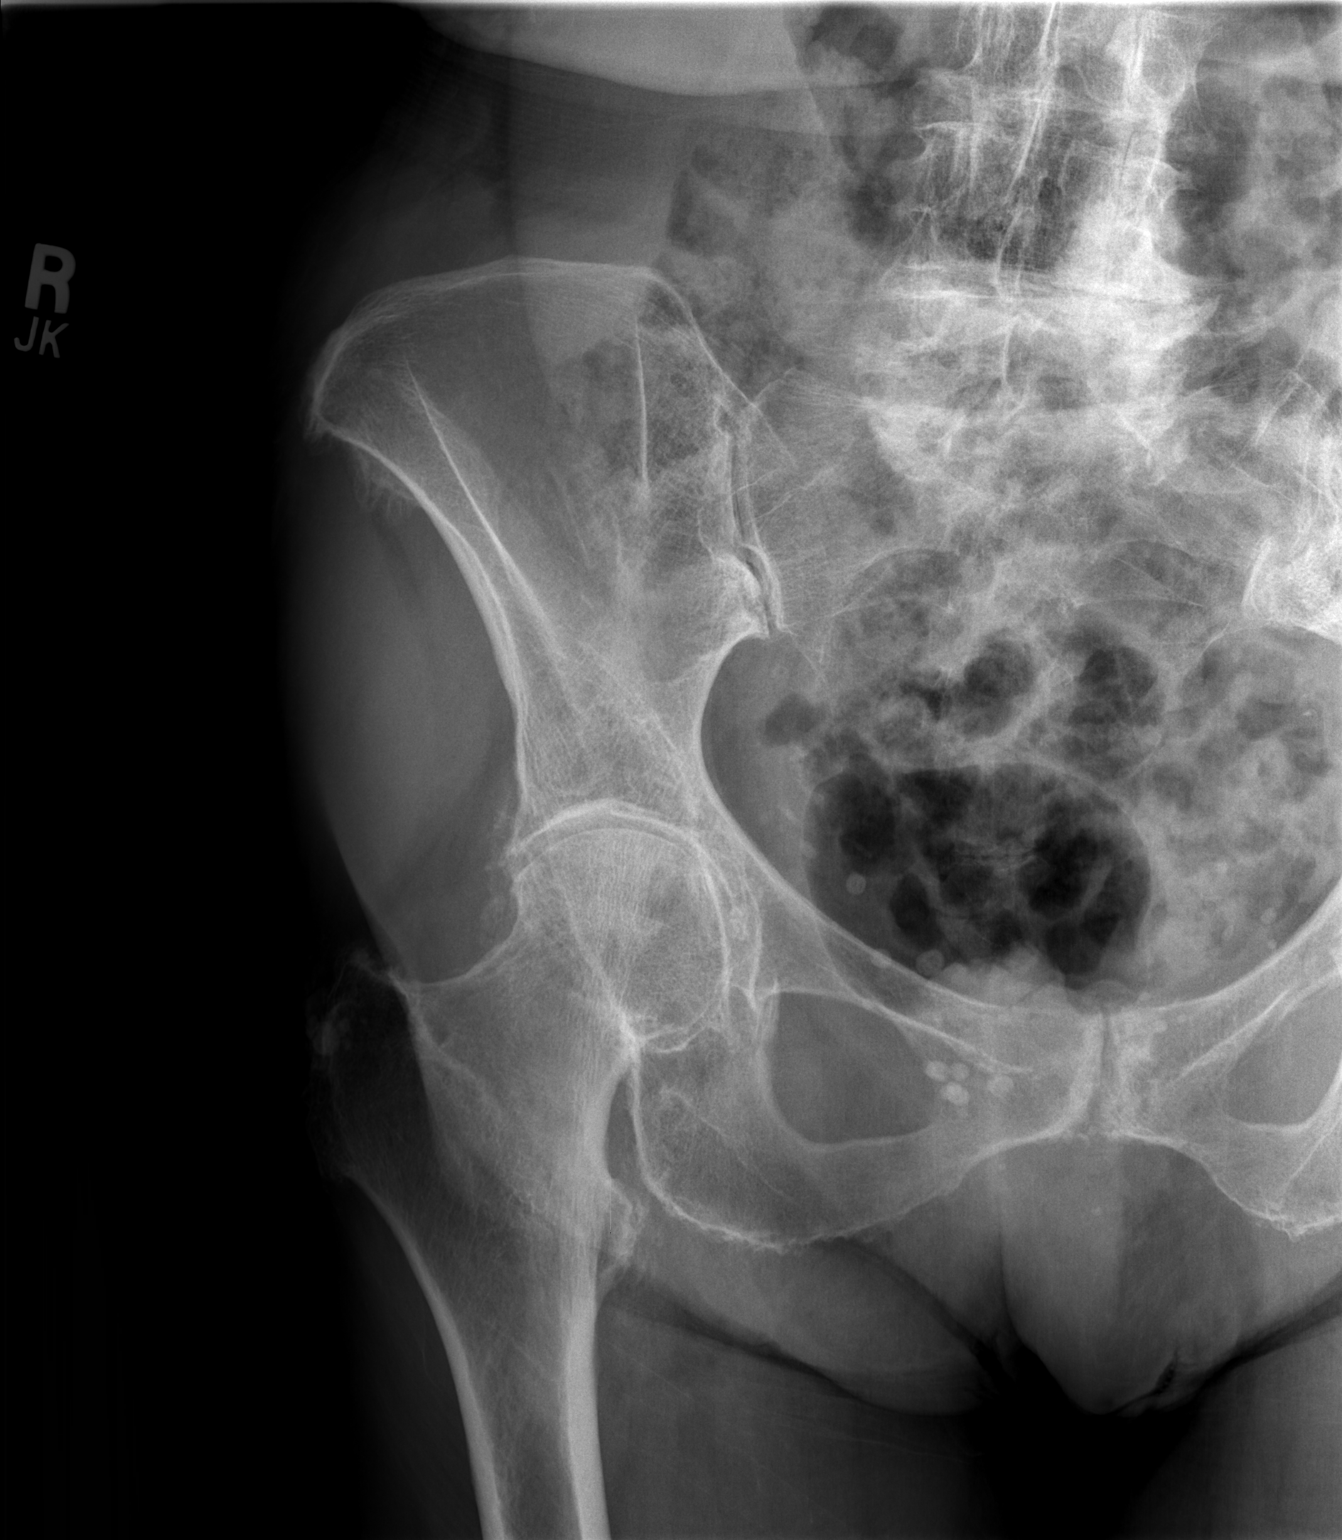

[t hip frog leg right]
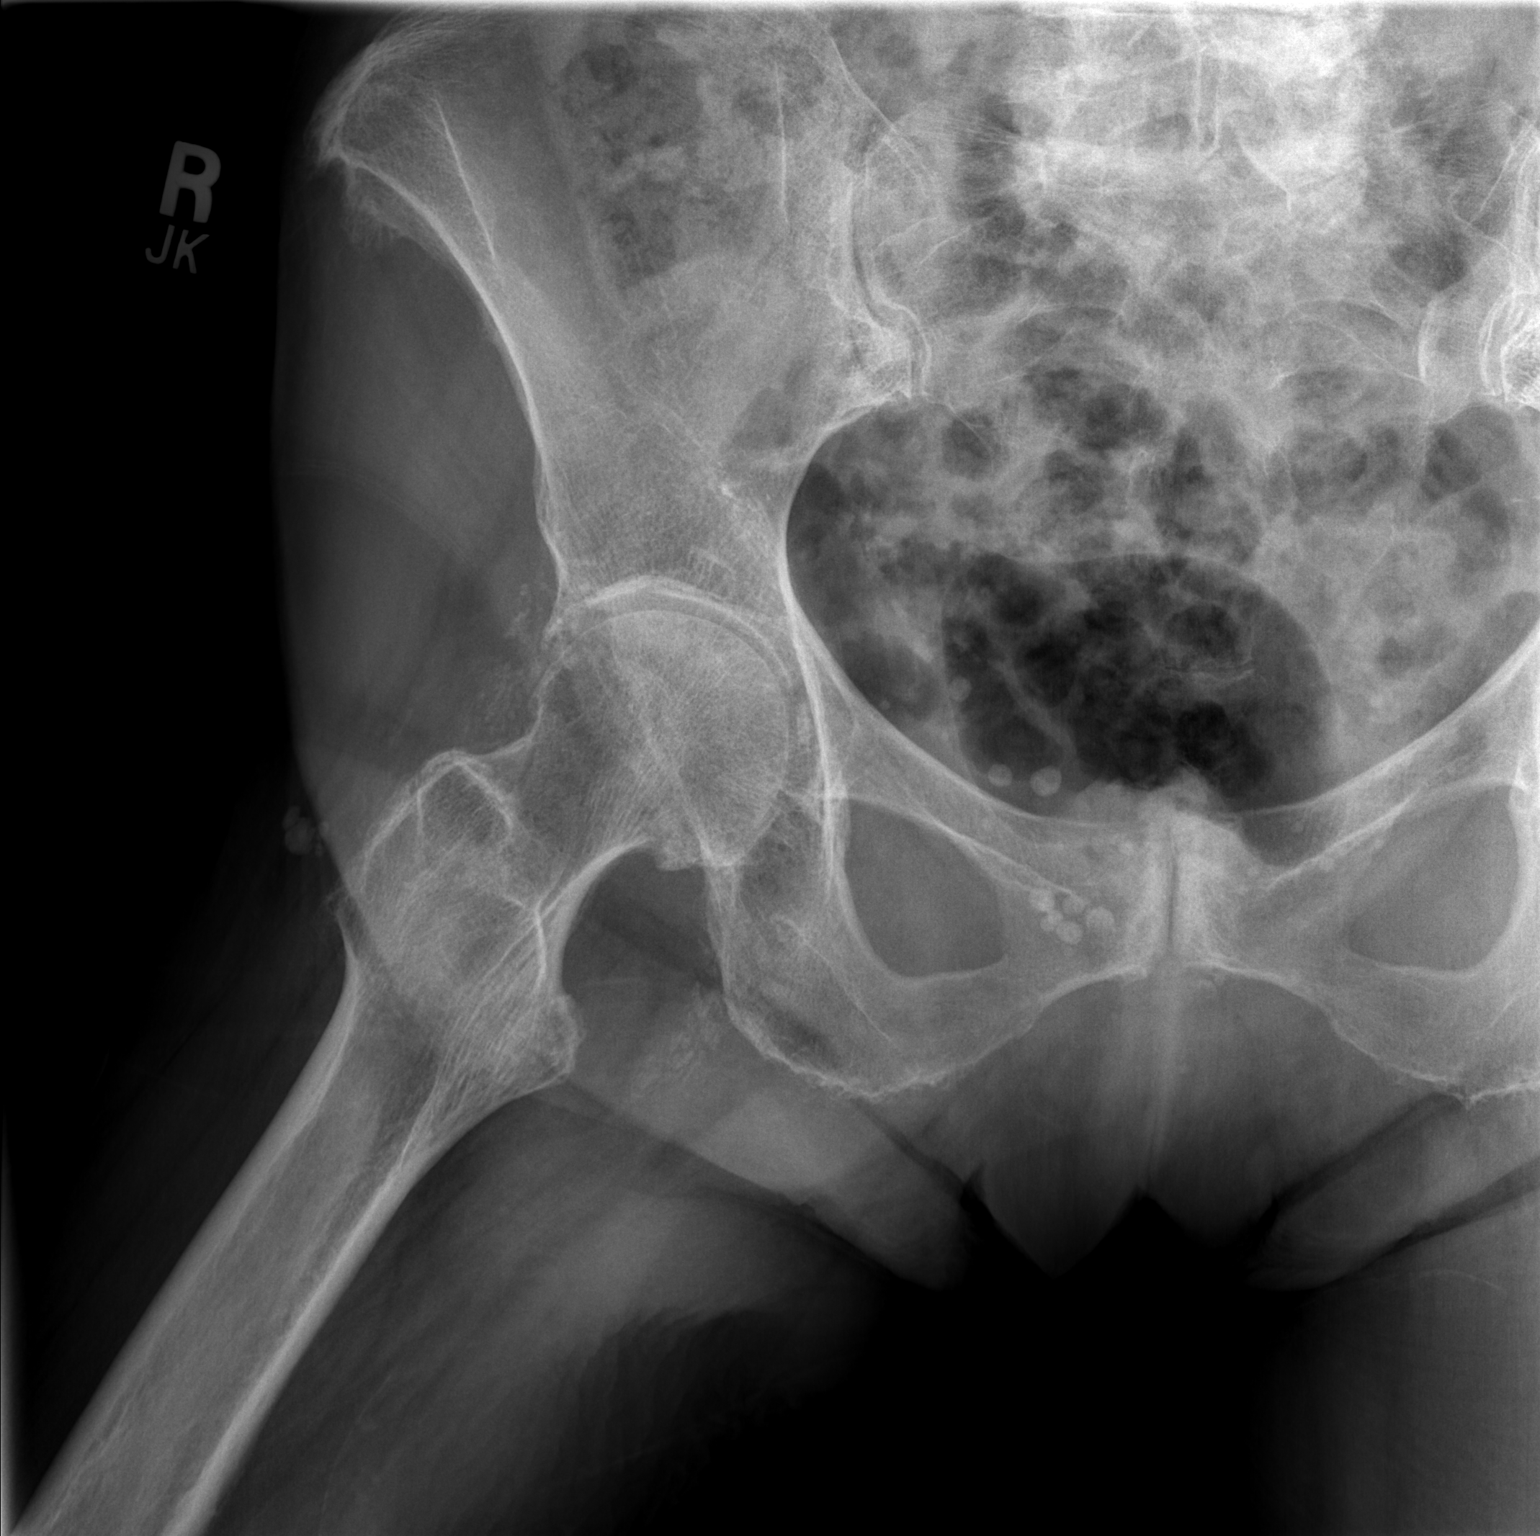

[2 of 2 positions shown; findings below may reference images not displayed]

FINDINGS: Mild right hip joint space narrowing and mild subchondral sclerosis
at the acetabulum. No fracture or dislocation. There are multiple
pelvic phleboliths incidentally noted.
IMPRESSION: Mild right hip osteoarthrosis without acute abnormality.

## 2018-12-20 ENCOUNTER — Other Ambulatory Visit: Payer: Self-pay | Admitting: Cardiology

## 2018-12-25 ENCOUNTER — Emergency Department (HOSPITAL_COMMUNITY)
Admission: EM | Admit: 2018-12-25 | Discharge: 2019-01-11 | Disposition: E | Payer: Medicare Other | Attending: Emergency Medicine | Admitting: Emergency Medicine

## 2018-12-25 DIAGNOSIS — I469 Cardiac arrest, cause unspecified: Secondary | ICD-10-CM | POA: Diagnosis not present

## 2018-12-25 DIAGNOSIS — I499 Cardiac arrhythmia, unspecified: Secondary | ICD-10-CM | POA: Diagnosis not present

## 2018-12-25 DIAGNOSIS — R404 Transient alteration of awareness: Secondary | ICD-10-CM | POA: Diagnosis not present

## 2018-12-25 DIAGNOSIS — R0689 Other abnormalities of breathing: Secondary | ICD-10-CM | POA: Diagnosis not present

## 2018-12-26 MED FILL — Medication: Qty: 1 | Status: AC

## 2019-01-11 NOTE — ED Provider Notes (Signed)
[facility]   CSN: [id number] Arrival date & time: 08-Apr-2019  1320    History   Chief Complaint Chief Complaint  Patient presents with  . Cardiac Arrest    HPI Kimberly Donovan is a 85 y.o. female.     HPI   85 year old female with a history of dyslipidemia and SVT presents with concern for unwitnessed cardiac arrest from [facility].  Per EMS, patient had unknown downtime and was found to be in asystole initially.  They had given her 8 rounds of epinephrine, found patient subsequently to be in PEA arrest.  They placed a King airway and brought her to the emergency department.  At time of arrival, they have been performing CPR for 50 minutes without return of spontaneous circulation.  She had end-tidal CO2 in the 30s and 40s.  Per report, patient reported feeling unwell this morning, but is not clear what specific symptoms she had.  Past Medical History:  Diagnosis Date  . Arthritis   . Dyslipidemia   . Dysphagia   . Dysrhythmia    states has palpitations-w/u/ neg 2003 per note Dr Donette LarryHusain  . H/O hiatal hernia    no meds  . HOH (hard of hearing)   . PONV (postoperative nausea and vomiting)   . Wears hearing aid    both ears    Patient Active Problem List   Diagnosis Date Noted  . PSVT (paroxysmal supraventricular tachycardia) (HCC) 09/25/2014  . Palpitations 01/19/2014  . Mild mitral regurgitation 01/19/2014  . Mild aortic regurgitation 01/19/2014  . PONV (postoperative nausea and vomiting)   . Arthritis   . Dysrhythmia   . Dyslipidemia   . HOH (hard of hearing)   . Wears hearing aid   . H/O hiatal hernia   . S/P left knee replacement 05/28/2011    Past Surgical History:  Procedure Laterality Date  . ABDOMINAL HYSTERECTOMY     with BSO  . CARPAL TUNNEL RELEASE     bilateral  . EYE SURGERY     repair of lid left/BILATERAL CATARACT EXTRACTION WITH IOL  . JOINT REPLACEMENT     right total knee  2009  . RECTOCELE REPAIR    . TENDON REPAIR Right 08/12/2012   Procedure: RIGHT TIB AND REPAIR VS RECONSTRUCTION POSSIBLE EHL TRANSFER AND GASTROC RECESSION;  Surgeon: Toni ArthursJohn Hewitt, MD;  Location: [facility];  Service: Orthopedics;  Laterality: Right;  . TOTAL KNEE ARTHROPLASTY  05/27/2011   Procedure: TOTAL KNEE ARTHROPLASTY;  Surgeon: Shelda PalMatthew D Olin, MD;  Location: [facility];  Service: Orthopedics;  Laterality: Left;  . TYMPANOPLASTY     right with skin grafting      OB History   No obstetric history on file.      Home Medications    Prior to Admission medications   Medication Sig Start Date End Date Taking? Authorizing Provider  metoprolol succinate (TOPROL-XL) 50 MG 24 hr tablet TAKE 1 TABLET DAILY WITH OR IMMEDIATELY FOLLOWING A MEAL. 12/23/18   Jake BatheSkains, Mark C, MD    Family History Family History  Problem Relation Age of Onset  . Anemia Neg Hx   . Arrhythmia Neg Hx   . Asthma Neg Hx   . Clotting disorder Neg Hx   . Fainting Neg Hx   . Heart attack Neg Hx   . Heart failure Neg Hx   . Hyperlipidemia Neg Hx   . Hypertension Neg Hx   . Osteoporosis  Mother   . Heart disease Mother   . Lung cancer Father   . Lung cancer Sister   . Colon cancer Brother   . Atrial fibrillation Sister   . Atrial fibrillation Sister   . Atrial fibrillation Sister   . Lung disease Brother   . Cancer Brother     Social History Social History   Tobacco Use  . Smoking status: Never Smoker  . Smokeless tobacco: Never Used  Substance Use Topics  . Alcohol use: Yes    Alcohol/week: 6.0 standard drinks    Types: 3 Glasses of wine, 3 Shots of liquor per week  . Drug use: No     Allergies   Patient has no known allergies.   Review of Systems Review of Systems  Unable to perform ROS: Patient unresponsive     Physical Exam Updated Vital Signs Ht 5\' 6"  (1.676 m)   Wt 54 kg   LMP  (LMP Unknown)   BMI 19.21 kg/m   Physical Exam Vitals signs and  nursing note reviewed.  Constitutional:      Appearance: She is ill-appearing.     Comments: Unresponsive King airway in place  HENT:     Head: Normocephalic and atraumatic.  Eyes:     Comments: 45mm nonreactive  Neck:     Musculoskeletal: Normal range of motion.  Cardiovascular:     Comments: Femoral pulse with CPR, no pulse on rhythm check Pulmonary:     Effort: Pulmonary effort is normal. No respiratory distress.  Musculoskeletal:        General: No tenderness.  Skin:    General: Skin is cool and dry.     Findings: No erythema or rash.  Neurological:     Mental Status: She is unresponsive.     GCS: GCS eye subscore is 1. GCS verbal subscore is 1. GCS motor subscore is 1.      ED Treatments / Results  Labs (all labs ordered are listed, but only abnormal results are displayed) Labs Reviewed - No data to display  EKG None  Radiology No results found.  Procedures .Critical Care Performed by: Gareth Morgan, MD Authorized by: Gareth Morgan, MD   Critical care provider statement:    Critical care time (minutes):  30   Critical care was time spent personally by me on the following activities:  Evaluation of patient's response to treatment, examination of patient, ordering and performing treatments and interventions, pulse oximetry, re-evaluation of patient's condition and obtaining history from patient or surrogate   (including critical care time)  Cardiopulmonary Resuscitation (CPR) Procedure Note Directed/Performed by: Tennis Must I personally directed ancillary staff and/or performed CPR in an effort to regain return of spontaneous circulation and to maintain cardiac, neuro and systemic perfusion.     Medications Ordered in ED Medications - No data to display   Initial Impression / Assessment and Plan / ED Course  I have reviewed the triage vital signs and the nursing notes.  Pertinent labs & imaging results that were available during my care of  the patient were reviewed by me and considered in my medical decision making (see chart for details).        85 year old female with a history of dyslipidemia and SVT presents with concern for unwitnessed cardiac arrest from [facility] with initial asystole followed by PEA and 50 minutes of CPR with EMS.  Patient with continued PEA arrest on arrival to the emergency department.  She [facility] good  [facility]st [facility]  CPR was continued in the ED on arrival with additional epinephrine, however given total duration of CPR, no sign of neurologic activity, continued PEA despite maximum effort by EMS for nearly an hour prior to arrival, discussed with team and stopped resuscitation. She had asystole on the monitor, no sign of respirations, no cardiac activity on ultrasound, and death was called at 1325.  Called the patient family they were notified and came to the emergency department with Chaplain consult.   Final Clinical Impressions(s) / ED Diagnoses   Final diagnoses:  Cardiac arrest [facility](HCC)    ED Discharge Orders    None       Alvira MondaySchlossman, Harlym Gehling, MD Jan 21, 2019 2131

## 2019-01-11 NOTE — ED Notes (Signed)
Kimberly Donovan (Daughter/POC-#[phone]) called for updated.

## 2019-01-11 NOTE — Progress Notes (Signed)
RT note: RT received patient from EMS with [facility] airway tube with Hepa filter. RT placed a peed valve and resumed ventilation while monitoring ETCO2. ETCO2 was 28-46.CPR continued until time of death called.

## 2019-01-11 NOTE — ED Notes (Signed)
Dr. Inda Merlin paged to [phone]-per Dr. Billy Fischer paged by Levada Dy

## 2019-01-11 NOTE — ED Notes (Signed)
Rhythm check -- asystole--CPR continued manually

## 2019-01-11 NOTE — Progress Notes (Signed)
   12/26/18 1400  Clinical Encounter Type  Visited With Health care provider;Family  Visit Type Initial;Spiritual support;Psychological support;ED;Death  Referral From Other (Comment) (ED)  Spiritual Encounters  Spiritual Needs Grief support  Stress Factors  Family Stress Factors Major life changes;Loss   Met w/ pt's 2 daughters and SIL in consult room.  Had already been told about pt's death.  Reminisced about mom.  RN Santiago Glad spoke w/ them some.  Did not need to speak w/ Dr. Chauncey Cruel.  Did not desire to see the pt, preferring to remember her "as she was."    Will use [facility], [place].  Will have pt cremated, so they said it might need to be [facility] location, but no general location preference.  NOK is daughter:  Lynford Humphrey, [address] [place], [place].  Phone [phone]   This chaplain let RN Santiago Glad know that I was charting this info.  Also provided pt placement card to this daughter.  Myra Gianotti resident, [phone]

## 2019-01-11 NOTE — ED Notes (Signed)
EPI x 1 given

## 2019-01-11 NOTE — ED Triage Notes (Signed)
Pt to ED via [facility] from [facility] -- [place] on-- CPR in progress--  \pt had told staff that she did not feel well this am. When staff checked on pt, she was found pulseless and apneic.  CPR started at 1230, arrived to ED at 1313.  Placed on monitor showing PEA, no pulses felt-  LMA in place Received EPi x 8 enroute

## 2019-01-11 NOTE — ED Notes (Signed)
CPR stopped per dr Billy Fischer due to medical futility

## 2019-01-11 DEATH — deceased
